# Patient Record
Sex: Female | Born: 1965 | Race: Black or African American | Hispanic: No | Marital: Single | Smoking: Never smoker
Health system: Southern US, Community
[De-identification: ages and names within clinical notes are randomized; demographics above are authoritative.]

## PROBLEM LIST (undated history)

## (undated) DIAGNOSIS — Z91018 Allergy to other foods: Secondary | ICD-10-CM

## (undated) DIAGNOSIS — M069 Rheumatoid arthritis, unspecified: Secondary | ICD-10-CM

## (undated) DIAGNOSIS — J309 Allergic rhinitis, unspecified: Secondary | ICD-10-CM

## (undated) DIAGNOSIS — J45909 Unspecified asthma, uncomplicated: Secondary | ICD-10-CM

## (undated) DIAGNOSIS — L563 Solar urticaria: Secondary | ICD-10-CM

## (undated) DIAGNOSIS — Z930 Tracheostomy status: Secondary | ICD-10-CM

## (undated) HISTORY — DX: Solar urticaria: L56.3

## (undated) HISTORY — PX: OTHER SURGICAL HISTORY: SHX169

## (undated) HISTORY — PX: KNEE SURGERY: SHX244

## (undated) HISTORY — DX: Unspecified asthma, uncomplicated: J45.909

## (undated) HISTORY — PX: HERNIA REPAIR: SHX51

## (undated) HISTORY — DX: Rheumatoid arthritis, unspecified: M06.9

## (undated) HISTORY — PX: VESICOVAGINAL FISTULA CLOSURE W/ TAH: SUR271

## (undated) HISTORY — DX: Allergy to other foods: Z91.018

## (undated) HISTORY — DX: Allergic rhinitis, unspecified: J30.9

## (undated) HISTORY — DX: Tracheostomy status: Z93.0

---

## 2014-01-22 DIAGNOSIS — M1612 Unilateral primary osteoarthritis, left hip: Secondary | ICD-10-CM | POA: Insufficient documentation

## 2014-05-05 DIAGNOSIS — M76899 Other specified enthesopathies of unspecified lower limb, excluding foot: Secondary | ICD-10-CM | POA: Insufficient documentation

## 2014-05-05 DIAGNOSIS — M24573 Contracture, unspecified ankle: Secondary | ICD-10-CM | POA: Insufficient documentation

## 2014-05-05 DIAGNOSIS — M7502 Adhesive capsulitis of left shoulder: Secondary | ICD-10-CM | POA: Insufficient documentation

## 2015-08-04 ENCOUNTER — Other Ambulatory Visit: Payer: Self-pay | Admitting: Allergy

## 2015-08-04 MED ORDER — MONTELUKAST SODIUM 10 MG PO TABS: 10.0000 mg | ORAL_TABLET | Freq: Every day | ORAL | Status: DC

## 2015-09-15 ENCOUNTER — Other Ambulatory Visit: Payer: Self-pay | Admitting: Pediatrics

## 2015-11-03 ENCOUNTER — Encounter: Payer: Self-pay | Admitting: Pediatrics

## 2015-11-03 ENCOUNTER — Ambulatory Visit (INDEPENDENT_AMBULATORY_CARE_PROVIDER_SITE_OTHER): Payer: Medicare Other | Admitting: Pediatrics

## 2015-11-03 VITALS — BP 100/68 | HR 86 | Temp 97.9°F | Resp 16 | Ht 67.72 in | Wt 184.6 lb

## 2015-11-03 DIAGNOSIS — M069 Rheumatoid arthritis, unspecified: Secondary | ICD-10-CM

## 2015-11-03 DIAGNOSIS — J301 Allergic rhinitis due to pollen: Secondary | ICD-10-CM | POA: Diagnosis not present

## 2015-11-03 DIAGNOSIS — T7800XA Anaphylactic reaction due to unspecified food, initial encounter: Secondary | ICD-10-CM | POA: Insufficient documentation

## 2015-11-03 DIAGNOSIS — Z430 Encounter for attention to tracheostomy: Secondary | ICD-10-CM

## 2015-11-03 DIAGNOSIS — T7800XD Anaphylactic reaction due to unspecified food, subsequent encounter: Secondary | ICD-10-CM | POA: Diagnosis not present

## 2015-11-03 DIAGNOSIS — J4540 Moderate persistent asthma, uncomplicated: Secondary | ICD-10-CM | POA: Diagnosis not present

## 2015-11-03 MED ORDER — MONTELUKAST SODIUM 10 MG PO TABS: 10.0000 mg | ORAL_TABLET | Freq: Every day | ORAL | Status: DC

## 2015-11-03 MED ORDER — HYDROXYZINE HCL 25 MG PO TABS: 25.0000 mg | ORAL_TABLET | Freq: Three times a day (TID) | ORAL | Status: DC | PRN

## 2015-11-03 MED ORDER — MOMETASONE FUROATE 50 MCG/ACT NA SUSP: NASAL | Status: DC

## 2015-11-03 MED ORDER — PROAIR HFA 108 (90 BASE) MCG/ACT IN AERS: INHALATION_SPRAY | RESPIRATORY_TRACT | Status: DC

## 2015-11-03 MED ORDER — LEVOCETIRIZINE DIHYDROCHLORIDE 5 MG PO TABS: ORAL_TABLET | ORAL | Status: DC

## 2015-11-03 MED ORDER — ALBUTEROL SULFATE (2.5 MG/3ML) 0.083% IN NEBU: 2.5000 mg | INHALATION_SOLUTION | RESPIRATORY_TRACT | Status: DC | PRN

## 2015-11-03 NOTE — Progress Notes (Signed)
[address] [place] [address] Dept: [phone]  FOLLOW UP NOTE  Patient ID: Katie Gates, female    DOB: 1966-01-01  Age: 50 y.o. MRN: [id number] Date of Office Visit: 11/03/2015  Assessment Chief Complaint: Urticaria and Nasal Congestion  HPI Katie Gates presents for evaluation of significant allergic symptoms in the past month. She has noticed some itchy skin also. Her asthma has been well controlled. She has been having nasal congestion.. 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 She avoids strawberries. She also avoids tree nuts, red dyes and onion  Current medications Symbicort 160/4.52 puffs every 12 hours, albuterol 0.083% one unit dose every 4 hours if needed, levocetirizine 5 mg in the morning, Singulair 10 mg at night, pro-air 2 puffs every 4 hours if needed, Spiriva Respimat 2.5 MCG's 1 puff every 24 hours. Her other medications are outlined in the chart.   Drug Allergies:  Allergies  Allergen Reactions  . Fluticasone Other (See Comments)    NOSE BLEEDS  . Keflex [Cephalexin]   . Robitussin (Alcohol Free) [Guaifenesin]     Physical Exam: BP 100/68 mmHg  Pulse 86  Temp(Src) 97.9 F (36.6 C) (Oral)  Resp 16  Ht 5' 7.72" (1.72 m)  Wt 184 lb 9.6 oz (83.734 kg)  BMI 28.30 kg/m2   Physical Exam  Constitutional: She is oriented to person, place, and time. She appears well-developed and well-nourished.  HENT:  Eyes normal. Ears normal. Nose moderate swelling of nasal turbinates. Pharynx normal.  Neck: Neck supple.  She has a tracheostomy  Cardiovascular:  S1 and S2 normal no murmurs  Pulmonary/Chest:  Clear to percussion and auscultation  Lymphadenopathy:    She has no cervical adenopathy.  Neurological: She is alert and oriented to person, place, and time.  Skin:  Clear  Psychiatric: She has a normal mood and affect. Her behavior is normal. Judgment and thought content normal.  Vitals reviewed.   Diagnostics:  FVC  3.53 L FEV1 2.74 L. Predicted FVC 3.19 L predicted FEV1 2.55 L-the spirometry is in the normal range  Assessment and Plan: 1. Moderate persistent asthma, uncomplicated   2. Allergic rhinitis due to pollen   3. Rheumatoid arthritis, involving unspecified site, unspecified rheumatoid factor presence (HCC)   4. Tracheostomy care (HCC)   5. Allergy with anaphylaxis due to food, subsequent encounter     Meds ordered this encounter  Medications  . PROAIR HFA 108 (90 Base) MCG/ACT inhaler    Sig: TAKE TWO PUFFS EVERY 4-6 HOURS AS NEEDED FOR COUGH OR WHEEZE.    Dispense:  8 g    Refill:  1  . montelukast (SINGULAIR) 10 MG tablet    Sig: Take 1 tablet (10 mg total) by mouth at bedtime.    Dispense:  90 tablet    Refill:  1  . levocetirizine (XYZAL) 5 MG tablet    Sig: TAKE ONE TABLET BY MOUTH ONCE A DAY FOR RUNNY NOSE OR ITCHING    Dispense:  30 tablet    Refill:  5  . albuterol (PROVENTIL) (2.5 MG/3ML) 0.083% nebulizer solution    Sig: Take 3 mLs (2.5 mg total) by nebulization every 4 (four) hours as needed for wheezing or shortness of breath.    Dispense:  75 mL    Refill:  1  . hydrOXYzine (ATARAX/VISTARIL) 25 MG tablet    Sig: Take 1 tablet (25 mg total) by mouth 3 (three) times daily as needed.    Dispense:  30  tablet    Refill:  1    Patient Instructions  Add hydroxyzine 25 mg-one tablet at night for itching Nasonex 1 spray per nostril twice a day for stuffy nose If your allergic symptoms get worse add prednisone 10 mg twice a day for 4 days, 10 mg on the fifth day Continue on your other medications but stop ipratropium nose spray Call me if you are not doing well on this treatment plan     Return in about 4 months (around 03/04/2016).    Thank you for the opportunity to care for this patient.  Please do not hesitate to contact me with questions.  Tonette Bihari, M.D.  [facility] [address] [place], [place] [address] [phone]

## 2015-11-03 NOTE — Patient Instructions (Signed)
Add hydroxyzine 25 mg-one tablet at night for itching Nasonex 1 spray per nostril twice a day for stuffy nose If your allergic symptoms get worse add prednisone 10 mg twice a day for 4 days, 10 mg on the fifth day Continue on your other medications but stop ipratropium nose spray Call me if you are not doing well on this treatment plan

## 2016-03-06 ENCOUNTER — Ambulatory Visit: Payer: Self-pay | Admitting: Pediatrics

## 2016-03-16 ENCOUNTER — Encounter: Payer: Self-pay | Admitting: Allergy & Immunology

## 2016-03-16 ENCOUNTER — Ambulatory Visit (INDEPENDENT_AMBULATORY_CARE_PROVIDER_SITE_OTHER): Payer: Medicare Other | Admitting: Allergy & Immunology

## 2016-03-16 VITALS — BP 110/70 | HR 88 | Temp 98.2°F | Resp 18

## 2016-03-16 DIAGNOSIS — J310 Chronic rhinitis: Secondary | ICD-10-CM | POA: Diagnosis not present

## 2016-03-16 DIAGNOSIS — T781XXD Other adverse food reactions, not elsewhere classified, subsequent encounter: Secondary | ICD-10-CM

## 2016-03-16 DIAGNOSIS — J4550 Severe persistent asthma, uncomplicated: Secondary | ICD-10-CM | POA: Diagnosis not present

## 2016-03-16 DIAGNOSIS — L508 Other urticaria: Secondary | ICD-10-CM

## 2016-03-16 MED ORDER — EPINEPHRINE 0.3 MG/0.3ML IJ SOAJ: 0.3000 mg | Freq: Once | INTRAMUSCULAR | 1 refills | Status: AC

## 2016-03-16 MED ORDER — HYDROCORTISONE 2.5 % EX CREA: TOPICAL_CREAM | Freq: Two times a day (BID) | CUTANEOUS | 0 refills | Status: DC

## 2016-03-16 NOTE — Progress Notes (Signed)
FOLLOW UP  Date of Service/Encounter:  03/16/16   Assessment:   Severe persistent asthma, uncomplicated  Chronic urticaria  Adverse food reaction, subsequent encounter  Chronic rhinitis   Asthma Reportables:  Severity: : severe persistent  Risk: high due to multiple comorbid conditions Control: well controlled  Seasonal Influenza Vaccine: no but encouraged    Plan/Recommendations:    1. Severe persistent asthma, uncomplicated - Continue Symbicort, Spiriva, Singulair - Continue albuterol as needed and regularly through the day. - Spirometry looks very good today.  2. Chronic urticaria - Increase cetirizine 10mg  twice daily.  - Continue hydroxyzine nightly.  - Could consider the addition of Xolair in the future, however unsure if this can be given in conjunction with her rheumatoid arthritis biologics.  3. Food allergies (beans, peanuts, onions, red dye) - EpiPen refilled today. - Continue to avoid all of your food allergy triggers. - Patient not interested in retesting and introduction at this time.   4. Chronic rhinitis - Continue Atrovent as needed. - Continue Nasonex, but increase to two sprays per nostril once daily. - We can consider another nasal spray if this is not working. - I also discussed allergen immunotherapy with patient, which could control her rhinitis as well as asthma. However, the last testing was positive only to grasses, therefore it would be debatable how helpful it would be. - Given that she is endorsing symptoms around so many different environments, repeat skin testing could be justified. - If she has more allergens identified via skin testing, we could consider allergen immunotherapy in the future.  5. Return to clinic in three months. We can do repeat skin testing at that time.    Subjective:   Katie Gates is a 50 y.o. female presenting today for follow up of  Chief Complaint  Patient presents with  . Follow-up    pt is  doing alot of itching when she goes outside in her eyes and on her skin Her eyes will get puffy  .  Katie Gates has a history of the following: Patient Active Problem List   Diagnosis Date Noted  . Moderate persistent asthma 11/03/2015  . Allergic rhinitis due to pollen 11/03/2015  . Rheumatoid arthritis (HCC) 11/03/2015  . Tracheostomy care (HCC) 11/03/2015  . Allergy with anaphylaxis due to food 11/03/2015    History obtained from: chart review and patient.  Katie Gates was referred by Dondra Prader., MD.     Yuval is a 50 y.o. female presenting for a follow up visit for severe persistent asthma, allergic rhinitis, urticaria. Maycel has a very compensated past medical history including rheumatoid arthritis and several complications including lung disease, calcified vocal cords, and esophageal thickening. She is followed closely by rheumatologist, pulmonologist, and gastroenterologist. She was last seen in March 2017 with urticaria and nasal congestion. At that time, she was continued on all of her asthma medications. Nasonex was added for her stuffy nose, and hydroxyzine 25 mg was added at night for itching. She was also given a prednisone burst to help with her urticaria.  Katie Gates reports today that she is doing well. Her hives and not been an issue since last visit, she received a cortisone shot. She continues to have problems with itching especially when she goes outside of the home. She breaks out when she opens the car door with the pollen. Everything "goes to hell" when she goes outside. Cleaning products and other environmental triggers make things worse as well. Therefore she is compensated by spending  most of her time indoors.  Asthma/Respiratory Symptom History: Rescue medication: albuterol nebulizer regularly throughout the day Controller(s): Symbicort 160/4.5 two puffs BID with a spacer, Singulair, Spiriva Respimat 2 puffs daily, (sleeps with O2 at  night) Triggers:  dust, fumes, pollens, smoke and strong odors  Average frequency of daytime symptoms: frequent Average frequency of nocturnal symptoms: frequent  Average frequency of exercise symptoms: absent Hospitalizations for respiratory symptoms since last visit (self report): 0 ED or Urgent Care visits for respiratory symptoms since last visit (self report): 1 (Jan or Feb) Oral/systemic corticosteroids for respiratory symptoms since last visit: varies  Allergic Rhinitis Symptom History:  Symptoms: rhinorrhea Times of year: Throughout the year Exacerbating environments: dust, fumes, pollens, smoke and strong odors  Treatments tried: Atrovent PRN works well but causes headaches, Nasonex one spray once daily, (has trialed fluticasone).  Allergy tested in the past: Yes (tested in April 2013 showed sensitization to 2 types of grasses only) On IT in the past: No    Food Allergies: Avoid pinto beans (oral itching), red dye (migraine), onions (migraine), peanuts (hives, breathing difficulties)  She does have an EpiPen on all times. She is not interested in trying these foods and is fine with continued avoidance. She does have lactose intolerance as well.   Otherwise, there have been no changes to the past medical history, surgical history, family history, or social history. She is currently disabled and spends much of her time in her home since she is able to limit her exposures.   Review of Systems: a 14-point review of systems is pertinent for what is mentioned in HPI.  Otherwise, all other systems were negative. Constitutional: negative other than that listed in the HPI Eyes: negative other than that listed in the HPI Ears, nose, mouth, throat, and face: negative other than that listed in the HPI Respiratory: negative other than that listed in the HPI Cardiovascular: negative other than that listed in the HPI Gastrointestinal: negative other than that listed in the HPI Genitourinary:  negative other than that listed in the HPI Integument: negative other than that listed in the HPI Hematologic: negative other than that listed in the HPI Musculoskeletal: negative other than that listed in the HPI Neurological: negative other than that listed in the HPI Allergy/Immunologic: negative other than that listed in the HPI    Objective:   Blood pressure 110/70, pulse 88, temperature 98.2 F (36.8 C), temperature source Oral, resp. rate 18, SpO2 96 %. There is no height or weight on file to calculate BMI.  Physical Exam:  General: Alert, interactive, in no acute distress. HEENT: TMs pearly gray, turbinates edematous and pale without discharge, post-pharynx erythematous. Tracheostomy in place without discharge.  Neck: Supple without lymphadenopathy. Lungs: Clear to auscultation without wheezing, rhonchi or rales. Moving air well in all lung fields.  CV: Normal S1, S2 without murmurs. Abdomen: Nondistended, nontender. Skin:Warm and dry, without lesions or rashes. Extremities: No clubbing, cyanosis or edema. Neuro: Grossly intact. No focal deficits noted   Diagnostic studies:  Spirometry: results normal (FEV1: 2.06/89%, FVC: 2.80/92%, FEV1/FVC: 73%).    Spirometry consistent with normal pattern      Malachi Bonds, MD [facility] [place]

## 2016-03-16 NOTE — Patient Instructions (Addendum)
1. Severe persistent asthma, uncomplicated - Continue Symbicort, Spiriva, Singulair - Continue albuterol as needed and regularly through the day.  2. Chronic urticaria - Increase cetirizine 10mg  twice daily.  - Continue hydroxyzine nightly.   3. Food allergies (beans, peanuts, onions, red dye) - EpiPen refilled today. - Continue to avoid all of your food allergy triggers.  4. Chronic rhinitis - Continue Atrovent as needed. - Continue Nasonex, but increase to two sprays per nostril once daily. - We can consider another nasal spray if this is not working.  5. Return to clinic in three months.   It was a pleasure to meet you today!

## 2016-05-23 ENCOUNTER — Other Ambulatory Visit: Payer: Self-pay | Admitting: Pediatrics

## 2016-06-21 DIAGNOSIS — R948 Abnormal results of function studies of other organs and systems: Secondary | ICD-10-CM | POA: Insufficient documentation

## 2016-06-22 ENCOUNTER — Encounter: Payer: Self-pay | Admitting: Allergy & Immunology

## 2016-06-22 ENCOUNTER — Ambulatory Visit (INDEPENDENT_AMBULATORY_CARE_PROVIDER_SITE_OTHER): Payer: Medicare Other | Admitting: Allergy & Immunology

## 2016-06-22 VITALS — BP 130/88 | HR 85 | Temp 98.1°F | Resp 12

## 2016-06-22 DIAGNOSIS — J4550 Severe persistent asthma, uncomplicated: Secondary | ICD-10-CM

## 2016-06-22 DIAGNOSIS — J3089 Other allergic rhinitis: Secondary | ICD-10-CM

## 2016-06-22 DIAGNOSIS — T781XXD Other adverse food reactions, not elsewhere classified, subsequent encounter: Secondary | ICD-10-CM | POA: Diagnosis not present

## 2016-06-22 DIAGNOSIS — B370 Candidal stomatitis: Secondary | ICD-10-CM | POA: Diagnosis not present

## 2016-06-22 DIAGNOSIS — L508 Other urticaria: Secondary | ICD-10-CM | POA: Diagnosis not present

## 2016-06-22 DIAGNOSIS — J4551 Severe persistent asthma with (acute) exacerbation: Secondary | ICD-10-CM | POA: Diagnosis not present

## 2016-06-22 DIAGNOSIS — J683 Other acute and subacute respiratory conditions due to chemicals, gases, fumes and vapors: Secondary | ICD-10-CM

## 2016-06-22 LAB — PULMONARY FUNCTION TEST

## 2016-06-22 MED ORDER — PROAIR HFA 108 (90 BASE) MCG/ACT IN AERS: INHALATION_SPRAY | RESPIRATORY_TRACT | 1 refills | Status: DC

## 2016-06-22 MED ORDER — ALBUTEROL SULFATE (2.5 MG/3ML) 0.083% IN NEBU: 2.5000 mg | INHALATION_SOLUTION | RESPIRATORY_TRACT | 1 refills | Status: DC | PRN

## 2016-06-22 MED ORDER — FLUCONAZOLE 150 MG PO TABS: 150.0000 mg | ORAL_TABLET | Freq: Every day | ORAL | 1 refills | Status: DC

## 2016-06-22 MED ORDER — LEVOCETIRIZINE DIHYDROCHLORIDE 5 MG PO TABS: 5.0000 mg | ORAL_TABLET | Freq: Every day | ORAL | 5 refills | Status: DC

## 2016-06-22 MED ORDER — MONTELUKAST SODIUM 10 MG PO TABS: 10.0000 mg | ORAL_TABLET | Freq: Every day | ORAL | 3 refills | Status: DC

## 2016-06-22 MED ORDER — LEVOFLOXACIN 500 MG PO TABS: 500.0000 mg | ORAL_TABLET | Freq: Every day | ORAL | 0 refills | Status: DC

## 2016-06-22 MED ORDER — HYDROCORTISONE 2.5 % EX CREA: TOPICAL_CREAM | Freq: Two times a day (BID) | CUTANEOUS | 5 refills | Status: DC

## 2016-06-22 MED ORDER — EPINEPHRINE 0.3 MG/0.3ML IJ SOAJ: INTRAMUSCULAR | 3 refills | Status: DC

## 2016-06-22 MED ORDER — SPIRIVA RESPIMAT 2.5 MCG/ACT IN AERS: INHALATION_SPRAY | RESPIRATORY_TRACT | 5 refills | Status: DC

## 2016-06-22 NOTE — Patient Instructions (Addendum)
1. Severe persistent asthma with acute exacerbation - Continue Symbicort, Spiriva, Singulair - Continue albuterol as needed and regularly through the day. - Start the prednisone pack provided in clinic (10mg  BID x 5 days) - We will get a serum IgE in case we decide to start Xolair.  - I will talk to the scientific team for Xolair to see if we can use it with Humira.   2. Chronic urticaria - Increase cetirizine 10mg  twice daily.   - Continue hydroxyzine nightly.   3. Food allergies (beans, peanuts, onions, red dye) - EpiPen refilled today. - Continue to avoid all of your food allergy triggers.  4. Chronic rhinitis - with acute sinusitis - Start Levaquin 500mg  daily for 14 days.  - We will send in a prescription for fluconazole to treat your oral thrush.  - I feel that there is something that we are missing in your allergy picture, therefore we will get an environmental allergy panel to see if there are any allergens we are missing.  - You can get the lab work done with your next regular blood draw.  - Continue Atrovent as needed. - Continue Nasonex, but increase to two sprays per nostril once daily. - We can consider another nasal spray if this is not working.  5. Return in about 3 months (around 09/22/2016).  Please inform of any Emergency Department visits, hospitalizations, or changes in symptoms. Call before going to the ED for breathing or allergy symptoms since we might be able to fit you in for a sick visit. Feel free to contact 09/24/2016 anytime with any questions, problems, or concerns.  It was a pleasure to see you again today!   Websites that have reliable patient information: 1. American Academy of Asthma, Allergy, and Immunology: www.aaaai.org 2. Food Allergy Research and Education (FARE): foodallergy.org 3. Mothers of Asthmatics: http://www.asthmacommunitynetwork.org 4. American College of Allergy, Asthma, and Immunology: www.acaai.org

## 2016-06-22 NOTE — Progress Notes (Addendum)
FOLLOW UP  Date of Service/Encounter:  06/22/16   Assessment:   Severe persistent asthma with acute exacerbation  Chronic urticaria  Adverse food reaction, subsequent encounter  Chronic nonseasonal allergic rhinitis due to pollen  Oral thrush   Asthma Reportables:  Severity: severe persistent  Risk: high due to multiple comorbitidies Control: not well controlled  Seasonal Influenza Vaccine: yes    Plan/Recommendations:   1. Severe persistent asthma with acute exacerbation - with a likely component of reactive airway dysfunction syndrome (sensitivities to scents, perfumes, etc) - Katie Gates does have moderate restriction on spirometry today (normal last time) without reversibility.  - Moderate restriction might be due to her concurrent sinus infection, therefore if present at the next appointment we will consider full pulmonary function testing. - Continue Symbicort, Spiriva, Singulair - Continue albuterol as needed and regularly through the day. - Start the prednisone pack provided in clinic (10mg  BID x 5 days) - We will get a serum IgE in case we decide to start Xolair.  - I did find a AAAAI web site Ask the Allergist article which recommended that multiple monoclonals can be used in the same patient. - The article pointed out that monoclonals are so specific that there are side effects or toxicities noted.   2. Chronic urticaria - Increase cetirizine 10mg  twice daily.   - Continue hydroxyzine nightly.  - I would prefer the addition of the Xolair, which would treat urticaria as well as the asthma.  - We also have the most experience with Xolair in the clinical setting.  3. Food allergies (beans, peanuts, onions, red dye) - EpiPen refilled today. - Continue to avoid all of your food allergy triggers.  4. Chronic rhinitis - with acute sinusitis - Start Levaquin 500mg  daily for 14 days.  - We will send in a prescription for fluconazole to treat your oral thrush.    - I feel that there is something that we are missing in your allergy picture, therefore we will get an environmental allergy panel to see if there are any allergens we are missing.  - You can get the lab work done with your next regular blood draw.  - Continue Atrovent as needed. - Continue Nasonex, but increase to two sprays per nostril once daily. - We can consider another nasal spray if this is not working.  5. Rheumatoid arthritis - stable on Humira with methotrexate - Continue with current treatment plan. - She does receive regular visits with Dr.  6. Return in about 3 months (around 09/22/2016).    Subjective:   Katie Gates is a 50 y.o. female presenting today for follow up of  Chief Complaint  Patient presents with  . Asthma    cough and drainage, yellow mucus  . Headache    sinus headache  .  Katie Gates has a history of the following: Patient Active Problem List   Diagnosis Date Noted  . Moderate persistent asthma 11/03/2015  . Allergic rhinitis due to pollen 11/03/2015  . Rheumatoid arthritis (HCC) 11/03/2015  . Tracheostomy care (HCC) 11/03/2015  . Allergy with anaphylaxis due to food 11/03/2015    History obtained from: chart review and patient.  Katie Gates was referred by 11/05/2015., MD.     Katie Gates is a 50 y.o. female presenting for a follow up visit. She was last seen in August 2017. At that time, I continued her on Symbicort, Spiriva, and Singulair. Her spirometry looked great. For her chronic urticaria, we  continued her on cetirizine 10 mg twice daily as well as hydroxyzine nightly. We refilled her EpiPen for her food allergies. She was not interested in retesting at that time. Her chronic rhinitis was stable with Atrovent as needed as well as Nasonex. We did increase her Nasonex to 2 sprays per nostril daily. We discussed performing skin testing at the next visit in hopes of finding more allergies for future  immunotherapy.  Since last visit, she has done well. She has had a sinus headache that has been going on for two weeks. She is using nasal saline rinses and Flonase with minimal relief. She estimates that she needs antibiotics around once per year, typically in the fall season. She does have an allergy to Keflex, which causes a head-to-toe rash. She has had more coughing and shortness of breath with the increased nasal drainage. She continues to have urticaria breakouts, even on the cetirizine and the hydroxyzine. The lesions do resolve and leave normal skin. There are no concurrent fevers but she does have baseline joint pain from her rheumatoid arthritis.   Katie Gates breathing has been less than ideal. She reports worsening respiratory problems with various triggers including perfumes and scents. She does have sensitivity to wool and has problems when she goes outside. She shares that she needs to cover up and sometimes wear a mask. We had discussed starting a biologic at the last visit to help with her breathing. She did have mild eosinophilia in the past (max of 100 around one year ago). I cannot find an IgE in the system at all.   Otherwise, there have been no changes to her past medical history, surgical history, family history, or social history.    Review of Systems: a 14-point review of systems is pertinent for what is mentioned in HPI.  Otherwise, all other systems were negative. Constitutional: negative other than that listed in the HPI Eyes: negative other than that listed in the HPI Ears, nose, mouth, throat, and face: negative other than that listed in the HPI Respiratory: negative other than that listed in the HPI Cardiovascular: negative other than that listed in the HPI Gastrointestinal: negative other than that listed in the HPI Genitourinary: negative other than that listed in the HPI Integument: negative other than that listed in the HPI Hematologic: negative other than that  listed in the HPI Musculoskeletal: negative other than that listed in the HPI Neurological: negative other than that listed in the HPI Allergy/Immunologic: negative other than that listed in the HPI    Objective:   Blood pressure 130/88, pulse 85, temperature 98.1 F (36.7 C), temperature source Oral, resp. rate 12, SpO2 97 %. There is no height or weight on file to calculate BMI.   Physical Exam:  General: Alert, interactive, in no acute distress. Very friendly and gracious. HEENT: TMs pearly gray, turbinates edematous without discharge, post-pharynx erythematous. Neck: Supple without thyromegaly. Lungs: Clear to auscultation without wheezing, rhonchi or rales. No increased work of breathing. Tracheostomy in place.  CV: Normal S1/S2, no murmurs. Capillary refill <2 seconds.  Abdomen: Nondistended, nontender. No guarding or rebound tenderness. Bowel sounds faint and present in all fields   Skin: Warm and dry, without lesions or rashes. Extremities:  No clubbing, cyanosis or edema. Neuro:   Grossly intact. No deficits noted. She is moving rather slowly today secondary to a recent fall.   Diagnostic studies:  Spirometry: results abnormal (FEV1: 1.61/63%, FVC: 1.90/60%, FEV1/FVC: 85%).    Spirometry consistent with normal pattern.  DuoNeb nebulizer treatment given in clinic with no improvement. In fact, while the FEV1 and FVC both remained stable, the FEF25-75% actually decreased following the treatment.   Allergy Studies: None    Malachi Bonds, MD [facility] [place]

## 2016-06-22 NOTE — Progress Notes (Signed)
I will contact patient next week and discuss Xolair option and get her part D card info

## 2016-06-26 ENCOUNTER — Other Ambulatory Visit: Payer: Self-pay

## 2016-06-26 MED ORDER — ALBUTEROL SULFATE (2.5 MG/3ML) 0.083% IN NEBU: 2.5000 mg | INHALATION_SOLUTION | RESPIRATORY_TRACT | 1 refills | Status: DC | PRN

## 2016-07-03 ENCOUNTER — Other Ambulatory Visit: Payer: Self-pay | Admitting: *Deleted

## 2016-07-03 MED ORDER — ALBUTEROL SULFATE (2.5 MG/3ML) 0.083% IN NEBU: 2.5000 mg | INHALATION_SOLUTION | RESPIRATORY_TRACT | 1 refills | Status: DC | PRN

## 2016-09-11 ENCOUNTER — Telehealth: Payer: Self-pay | Admitting: Allergy

## 2016-09-11 NOTE — Telephone Encounter (Signed)
Per patients insurance Spiriva not covered. Incruse Ellipta is covered under tier 3.Please advise.

## 2016-09-11 NOTE — Telephone Encounter (Signed)
We can substitute the Incruse Ellipta. I do not have any experience with it, but I ancipitate it should work just as well.  Thanks, Malachi Bonds, MD [facility] [place]

## 2016-09-12 ENCOUNTER — Other Ambulatory Visit: Payer: Self-pay | Admitting: Allergy

## 2016-09-12 ENCOUNTER — Telehealth: Payer: Self-pay | Admitting: Allergy

## 2016-09-12 MED ORDER — UMECLIDINIUM BROMIDE 62.5 MCG/INH IN AEPB: 1.0000 | INHALATION_SPRAY | Freq: Every day | RESPIRATORY_TRACT | 3 refills | Status: DC

## 2016-09-12 NOTE — Telephone Encounter (Addendum)
Informed patient insurance would not pay for Spiriva.Per Dr Dellis Anes called in [facility].

## 2016-09-12 NOTE — Telephone Encounter (Signed)
Dr. Dellis Anes what is the directions for the Incruse.

## 2016-09-12 NOTE — Telephone Encounter (Signed)
One inhalation once daily.   Thanks, Malachi Bonds, MD [facility] [place]

## 2016-09-13 NOTE — Telephone Encounter (Signed)
Called in rx and let pt. Know.

## 2016-09-21 ENCOUNTER — Ambulatory Visit (INDEPENDENT_AMBULATORY_CARE_PROVIDER_SITE_OTHER): Payer: Medicare Other | Admitting: Allergy & Immunology

## 2016-09-21 ENCOUNTER — Encounter: Payer: Self-pay | Admitting: Allergy & Immunology

## 2016-09-21 VITALS — BP 104/70 | HR 88 | Temp 98.5°F | Resp 24

## 2016-09-21 DIAGNOSIS — L508 Other urticaria: Secondary | ICD-10-CM

## 2016-09-21 DIAGNOSIS — T781XXD Other adverse food reactions, not elsewhere classified, subsequent encounter: Secondary | ICD-10-CM

## 2016-09-21 DIAGNOSIS — J3089 Other allergic rhinitis: Secondary | ICD-10-CM

## 2016-09-21 DIAGNOSIS — R942 Abnormal results of pulmonary function studies: Secondary | ICD-10-CM

## 2016-09-21 DIAGNOSIS — J4550 Severe persistent asthma, uncomplicated: Secondary | ICD-10-CM | POA: Diagnosis not present

## 2016-09-21 NOTE — Patient Instructions (Addendum)
1. Severe persistent asthma - with stable spirometry from last time - Continue Symbicort, Spiriva, Singulair - Continue albuterol as needed and regularly through the day. - We will get a serum IgE in case we decide to start Xolair ([facility] lost the last one). - It is safe to use two monoclonal antibodies together (i.e. Xolair with Humira).   2. Chronic urticaria - Continue with cetirizine 10mg  twice daily.   - Continue hydroxyzine nightly.   3. Food allergies (beans, peanuts, onions, red dye) - EpiPen refilled today. - Continue to avoid all of your food allergy triggers.  4. Chronic rhinitis - I feel that there is something that we are missing in your allergy picture, therefore we will get an environmental allergy panel to see if there are any allergens we are missing.  - We will get that lab work today ([facility] lost the last one). - Continue Atrovent as needed. - Continue Nasonex two sprays per nostril once daily. - We can consider another nasal spray if this is not working.  5. Return in about 3 months (around 12/19/2016).  Please inform 12/21/2016 of any Emergency Department visits, hospitalizations, or changes in symptoms. Call us before going to the ED for breathing or allergy symptoms since we might be able to fit you in for a sick visit. Feel free to contact us anytime with any questions, problems, or concerns.  It was a pleasure to see you again today!   Websites that have reliable patient information: 1. American Academy of Asthma, Allergy, and Immunology: www.aaaai.org 2. Food Allergy Research and Education (FARE): foodallergy.org 3. Mothers of Asthmatics: http://www.asthmacommunitynetwork.org 4. American College of Allergy, Asthma, and Immunology: www.acaai.org

## 2016-09-21 NOTE — Progress Notes (Signed)
FOLLOW UP  Date of Service/Encounter:  09/21/16   Assessment:   Severe persistent asthma, uncomplicated  Adverse food reaction (beans, peanuts, onions, red dye)  Chronic urticaria  Chronic allergic rhinitis    Asthma Reportables:  Severity: severe persistent  Risk: high Control: not well controlled  Seasonal Influenza Vaccine: yes    Plan/Recommendations:   1. Severe persistent asthma with acute exacerbation - with a likely component of reactive airway dysfunction syndrome (sensitivities to scents, perfumes, etc) - Katie Gates does have moderate restriction on spirometry today (was normal in August 2017).  - Could consider full pulmonary function testing in the future.  - Continue with Symbicort two puffs twice daily, Incruse (replaced Spiriva due to insurance coverage), and Singulair daily.  - Continue albuterol as needed and regularly through the day. - We will get a serum IgE in case we decide to start Xolair ([facility] lost the last one). - It is safe to use two monoclonal antibodies together (i.e. Xolair with Humira) as they are so specific that there are few side effects or toxicities noted when used together.   2. Chronic urticaria - Continue with cetirizine 64m twice daily.   - Continue hydroxyzine nightly.  - I would prefer the addition of the Xolair, which would treat urticaria as well as the asthma.  - We also have the most experience with Xolair in the clinical setting.  3. Food allergies (beans, peanuts, onions, red dye) - EpiPen refilled today. - Continue to avoid all of your food allergy triggers.  4. Chronic rhinitis - I feel that there is something that we are missing in your allergy picture, therefore we will get an environmental allergy panel to see if there are any allergens we are missing.  - We will get that lab work today ([facility] lost the last one). - Continue Atrovent as needed. - Continue Nasonex two sprays per nostril once daily. - We  can consider another nasal spray if this is not working.  5. Rheumatoid arthritis - stable on Humira with methotrexate - Continue with current treatment plan. - She does receive regular visits with Dr. RFlorene Glen 6. Return in about 3 months (around 12/19/2016).   Subjective:   Katie Gates a 51y.o. female presenting today for follow up of  Chief Complaint  Patient presents with  . Cough    VClydene Burackhas a history of the following: Patient Active Problem List   Diagnosis Date Noted  . Abnormal biliary HIDA scan 06/21/2016  . Moderate persistent asthma 11/03/2015  . Allergic rhinitis due to pollen 11/03/2015  . [place] ([place]  . [place] ([place] 11/03/[place]  . 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 Adhesive bursitis of left shoulder 05/05/2014  . 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 Arthritis of left hip 01/22/2014    History obtained from: chart review and patient.  Katie Gates referred by SVerdell Carmine, MD.     Katie Gates a 51y.o. female presenting for a follow up visit. [name] last seen in November 2017. At that time, she was diagnosed with a asthma exacerbation and started on a small dose of prednisone. She did have moderate restriction on her spirometry which I felt was secondary to her sinus infection. It had been normal in August 2017. I continued her on Symbicort, Spiriva, and Singulair as well as albuterol as needed. We did discuss starting Xolair and I ordered  a serum IgE. Her chronic urticaria was under good control with cetirizine. She continue to avoid all of her food triggers for her food allergies. He treated her with 14 days of Levaquin for her acute sinusitis and obtained an environmental allergy panel to see if there are any allergens contributing to her symptoms. She was continued on Atrovent as needed and Nasonex. She had concerns last time about  starting Xolair given the fact that she is on Humira. However, I discussed her situation with some other allergists who felt that two biologics would have minimal side effects since there mechanisms of action are so specifically targeted.  Since last visit, she has mostly done well. However, she continues to cough when she is out in public. As long as she is inside her home, she does very well. When going out in public around various scents and exposures, she does have increased coughing including rhinorrhea as well as itchy watery eyes. She has tried wearing mask in public, but this has not helped. She does report that she got her blood work, but no blood work showing up in her system. We did call [facility] today, who reported that they lost her blood sample. Her Spiriva was not covered by her insurance, and Incruse Ellipta (umeclidinium) was substituted.  As long she is inside her home, her allergic rhinitis is well controlled. She has had no problems with urticaria; she is on cetirizine 10 mg twice daily. She did have one breakout prior to Christmas. She feels that her rheumatoid arthritis is well controlled on the Humira and methotrexate. 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   Otherwise, there have been no changes to her past medical history, surgical history, family history, or social history.    Review of Systems: a 14-point review of systems is pertinent for what is mentioned in HPI.  Otherwise, all other systems were negative. Constitutional: negative other than that listed in the HPI Eyes: negative other than that listed in the HPI Ears, nose, mouth, throat, and face: negative other than that listed in the HPI Respiratory: negative other than that listed in the HPI Cardiovascular: negative other than that listed in the HPI Gastrointestinal: negative other than that listed in the HPI Genitourinary: negative other than that listed in the HPI Integument: negative other than that listed in the  HPI Hematologic: negative other than that listed in the HPI Musculoskeletal: negative other than that listed in the HPI Neurological: negative other than that listed in the HPI Allergy/Immunologic: negative other than that listed in the HPI    Objective:   Blood pressure 104/70, pulse 88, temperature 98.5 F (36.9 C), temperature source Oral, resp. rate (!) 24. There is no height or weight on file to calculate BMI.   Physical Exam:  General: Alert, interactive, in no acute distress. Very friendly and gracious. Smiling.  HEENT: TMs pearly gray, turbinates edematous without discharge, post-pharynx erythematous. Neck:   Supple without thyromegaly. Lungs: Clear to auscultation without wheezing, rhonchi or rales. No increased work of breathing. Some intermittent coughing. Tracheostomy in place.  CV:      Normal S1/S2, no murmurs. Capillary refill <2 seconds.  Skin:    Warm and dry, without lesions or rashes. Extremities:  No clubbing, cyanosis or edema. Neuro:   Grossly intact. No deficits noted.   Diagnostic studies:  Spirometry: results abnormal (FEV1: 1.59/60%, FVC: 1.72/52%, FEV1/FVC: 92%).    Spirometry consistent with possible restrictive disease.   Compared to her values obtained at the last visit,  her FEV1 and FVC are both slightly lower but generally stable. She did have a normal spirometry when I first met her in August 2017.  Allergy Studies: None    Salvatore Marvel, MD [facility]

## 2016-09-23 LAB — CP584 ZONE 3
Allergen, Black Locust, Acacia9: <0.1 kU/L
Allergen, C. Herbarum, M2: <0.1 kU/L
Allergen, Cedar tree, t12: <0.1 kU/L
Allergen, Comm Silver Birch, t9: <0.1 kU/L
Allergen, D pternoyssinus,d7: <0.1 kU/L
Allergen, Mucor Racemosus, M4: <0.1 kU/L
Allergen, Oak,t7: <0.1 kU/L
Allergen, P. notatum, m1: <0.1 kU/L
Bahia Grass: <0.1 kU/L
Cat Dander: <0.1 kU/L
Cockroach: <0.1 kU/L
Common Ragweed: <0.1 kU/L
Dog Dander: <0.1 kU/L
Elm IgE: <0.1 kU/L
Johnson Grass: <0.1 kU/L
Meadow Grass: <0.1 kU/L
Pecan/Hickory Tree IgE: <0.1 kU/L
Plantain: <0.1 kU/L

## 2016-09-23 LAB — IGE: IgE (Immunoglobulin E), Serum: 66 kU/L (ref <115)

## 2016-09-23 NOTE — Addendum Note (Signed)
Addended by: Alfonse Spruce on: 09/23/2016 09:37 PM   Modules accepted: Orders

## 2016-09-25 ENCOUNTER — Telehealth: Payer: Self-pay

## 2016-09-25 NOTE — Telephone Encounter (Signed)
Alfonse Spruce, MD  [facility]        Sorry one more thing - I would really like to get full pulmonary function testing on Katie Gates. She had abnormal spirometry on Friday and her last visit before that. It had been normal in August. I just wanted to make sure we are not sealing with anything more serious than asthma. Thanks!   Malachi Bonds, MD FAAAAI  [facility] with pt and informed her of dr gallaghers stating. She stated she has already scheduled this.

## 2016-09-29 ENCOUNTER — Other Ambulatory Visit: Payer: Self-pay | Admitting: Pediatrics

## 2016-10-02 ENCOUNTER — Ambulatory Visit (HOSPITAL_COMMUNITY)
Admission: RE | Admit: 2016-10-02 | Discharge: 2016-10-02 | Disposition: A | Discharge status: Home / Self Care | Payer: Medicare Other | Source: Ambulatory Visit | Attending: Allergy & Immunology | Admitting: Allergy & Immunology

## 2016-10-02 DIAGNOSIS — J4550 Severe persistent asthma, uncomplicated: Secondary | ICD-10-CM

## 2016-10-02 DIAGNOSIS — R942 Abnormal results of pulmonary function studies: Secondary | ICD-10-CM

## 2016-10-25 ENCOUNTER — Ambulatory Visit (INDEPENDENT_AMBULATORY_CARE_PROVIDER_SITE_OTHER): Payer: Medicare Other | Admitting: *Deleted

## 2016-10-25 DIAGNOSIS — L501 Idiopathic urticaria: Secondary | ICD-10-CM | POA: Diagnosis not present

## 2016-10-25 MED ORDER — OMALIZUMAB 150 MG ~~LOC~~ SOLR
300.0000 mg | SUBCUTANEOUS | Status: DC
  Administered 2016-10-25 – 2016-12-20 (×3): 300 mg via SUBCUTANEOUS

## 2016-11-16 ENCOUNTER — Telehealth: Payer: Self-pay | Admitting: *Deleted

## 2016-11-16 NOTE — Telephone Encounter (Signed)
Pt calling with questions regarding xolair. Informed her that we have delivery date set for 04/17 and she has a xolair appt scheduled with [place] on 04/19 at 10:30.

## 2016-11-22 ENCOUNTER — Ambulatory Visit (INDEPENDENT_AMBULATORY_CARE_PROVIDER_SITE_OTHER): Payer: Medicare Other

## 2016-11-22 DIAGNOSIS — L501 Idiopathic urticaria: Secondary | ICD-10-CM | POA: Diagnosis not present

## 2016-12-20 ENCOUNTER — Ambulatory Visit (INDEPENDENT_AMBULATORY_CARE_PROVIDER_SITE_OTHER): Payer: Medicare Other

## 2016-12-20 DIAGNOSIS — L501 Idiopathic urticaria: Secondary | ICD-10-CM

## 2016-12-21 ENCOUNTER — Ambulatory Visit: Payer: Medicare Other | Admitting: Allergy & Immunology

## 2016-12-21 ENCOUNTER — Ambulatory Visit (INDEPENDENT_AMBULATORY_CARE_PROVIDER_SITE_OTHER): Payer: Medicare Other | Admitting: Allergy & Immunology

## 2016-12-21 ENCOUNTER — Encounter: Payer: Self-pay | Admitting: Allergy & Immunology

## 2016-12-21 VITALS — BP 98/70 | HR 100 | Temp 98.2°F | Resp 18 | Ht 66.5 in | Wt 175.0 lb

## 2016-12-21 DIAGNOSIS — J4550 Severe persistent asthma, uncomplicated: Secondary | ICD-10-CM | POA: Diagnosis not present

## 2016-12-21 DIAGNOSIS — J3089 Other allergic rhinitis: Secondary | ICD-10-CM

## 2016-12-21 DIAGNOSIS — T781XXD Other adverse food reactions, not elsewhere classified, subsequent encounter: Secondary | ICD-10-CM

## 2016-12-21 DIAGNOSIS — D849 Immunodeficiency, unspecified: Secondary | ICD-10-CM

## 2016-12-21 DIAGNOSIS — M069 Rheumatoid arthritis, unspecified: Secondary | ICD-10-CM

## 2016-12-21 DIAGNOSIS — D899 Disorder involving the immune mechanism, unspecified: Secondary | ICD-10-CM

## 2016-12-21 DIAGNOSIS — L501 Idiopathic urticaria: Secondary | ICD-10-CM | POA: Diagnosis not present

## 2016-12-21 DIAGNOSIS — T7819XD Other adverse food reactions, not elsewhere classified, subsequent encounter: Secondary | ICD-10-CM

## 2016-12-21 MED ORDER — MONTELUKAST SODIUM 10 MG PO TABS: 10.0000 mg | ORAL_TABLET | Freq: Every day | ORAL | 3 refills | Status: DC

## 2016-12-21 MED ORDER — METHYLPREDNISOLONE ACETATE 80 MG/ML IJ SUSP
80.0000 mg | Freq: Once | INTRAMUSCULAR | Status: AC
  Administered 2016-12-21: 80 mg via INTRAMUSCULAR

## 2016-12-21 MED ORDER — BUDESONIDE-FORMOTEROL FUMARATE 160-4.5 MCG/ACT IN AERO: 2.0000 | INHALATION_SPRAY | Freq: Two times a day (BID) | RESPIRATORY_TRACT | 5 refills | Status: DC

## 2016-12-21 MED ORDER — IPRATROPIUM-ALBUTEROL 0.5-2.5 (3) MG/3ML IN SOLN: 3.0000 mL | Freq: Four times a day (QID) | RESPIRATORY_TRACT | 1 refills | Status: DC | PRN

## 2016-12-21 MED ORDER — ALBUTEROL SULFATE HFA 108 (90 BASE) MCG/ACT IN AERS: 2.0000 | INHALATION_SPRAY | RESPIRATORY_TRACT | 1 refills | Status: DC | PRN

## 2016-12-21 NOTE — Progress Notes (Signed)
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 Severe persistent asthma with a component of reactive airways dysfunction syndrome - with decreased lung function today following a recent asthma exacerbation - We will work on getting the Spiriva approved instead of the Incruse Ellipta since the Spiriva was working better. - We will work on getting Xolair approved for every two weeks instead of monthly. - We will give a dose of injected steroid to help with improving you lung function and getting you over your current illness. - We will send in a prescription for DuoNeb nebulizer treatments which can be used instead of albuterol to help with opening up your lungs during respiratory flares.  - Daily controller medication(s): Symbicort 160/4.5 two puffs twice daily + Singulair 10mg  once daily + Spiriva two inhalations daily + Xolair monthly - Rescue medications: Proventil 4 puffs every 4-6 hours as needed, albuterol nebulizer one vial puffs every 4-6 hours as needed or DuoNeb nebulizer one vial every 4-6 hours as needed - Asthma control goals:  * Full participation in all desired activities (may need albuterol before activity) * Albuterol use two time or less a week on average (not counting use with activity) * Cough interfering with sleep two time or less a month * Oral steroids no more than once a year * No hospitalizations  2. Chronic urticaria - Continue with cetirizine 10mg  as needed.   - Continue  hydroxyzine nightly.  - Hopefully increasing the Xolair to every two weeks will help control the itching and hives more.   3. Food allergies (beans, peanuts, onions, red dye) - EpiPen is up to date. - Continue to avoid all of your food allergy triggers.  4. Non-allergic rhinitis - with a recent environmental panel that was negative - Continue Atrovent as needed. - Continue Nasonex two sprays per nostril once daily.  5. Return in about 2 months (around 02/20/2017).   Subjective:   Katie Gates is a 51 y.o. female presenting today for follow up of  Chief Complaint  Patient presents with  . Asthma    seen at the ED Saturday for asthma exacerbation. still has ongoing cough. doing somewhat better, but she does still have episodes at night.     Katie Gates has a history of the following: Patient Active Problem List   Diagnosis Date Noted  . Abnormal biliary HIDA scan 06/21/2016  . Moderate persistent asthma 11/03/2015  . Allergic rhinitis due to pollen 11/03/2015  . Rheumatoid arthritis (HCC) 11/03/2015  . Tracheostomy care (HCC) 11/03/2015  . 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 Adhesive bursitis of left shoulder 05/05/2014  . 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 Arthritis of left hip 01/22/2014    History obtained from: chart review and patient.  Katie Gates was referred by 01/24/2014., MD.     Katie Gates is a 51 y.o. female presenting for a follow up visit. 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 She had recently been changed from Spiriva to Incruse due to insurance coverage, but otherwise she remained on Symbicort 160/4.5 two puffs BID as well as Singulair 10mg  daily. We did obtain a  serum IgE, which was elevated enough to qualify her for Xolair, which she started on 10/25/16. She has a history of chronic urticaria which was fairly well controlled with suppressive antihistamine dosing. We felt that there was likely an allergic trigger which we were missing, therefore we sent an environmental sIgE panel which was negative.   Since the last visit, she has not done well. She was in the ED on Thursday for an asthma exacerbation. She was given Decadron injection and albuterol nebulizer treatments. She woke up acutely on Thursday morning one week ago with these symptoms. She had a similar reaction the week before that ED visit. CXR was normal and she was treated with amoxicillin. She is using her albuterol at a lot at home for the last couple of weeks.   After her ER visit, she was asked to follow up with Pulmonology, which she did on May 14th. She was evaluated by May 16 PA, who works with Dr. Helene Kelp. At the time, she had dark secretions and was changed to azithromycin from amoxicillin. Pulmonology sent multiple labs, including a fungal antibody panel, hypersensitivity pneumonitis panel, and a respiratory allergen panel as well as a Quantiferon gold.   Following the visit with Dr. Su Monks, she has done better with regards to her tracheal discharge. It has cleared and lessened significantly. However she continues to cough with deep inspiration. She remains on her albuterol 2-3 times throughout the day. She did receive one dose of Decadron in the ED and was told that she was could go back to the ED for another dose Decadron if she needed it. However, se dopes not want to wait in the ED again, which has kept her from obtaining another steroid injection. She is unsure what triggered her current episode, but her granddaughter was sick with a cold recently.   Katie Gates reports that her Xolair seems to be working well, however it seems to wear off after a couple of weeks. It is controlling her  urticaria well, but after two weeks she starts to itch and her breathing seems to worsen. She has now received two injections of Xolair total.   Today she continues to have a restrictive pattern on spirometry, although it should be noted that she is coughing with deep breathing. We had discussed performing full pulmonary function testing at previous visits, however she was unable to perform the full maneuver and ended up just doing spirometry instead. Her last chest CT was performed in 2004, as far I as can tell, and there were not abnormalities noted at that time.  Katie Gates remains on Humira, prednisone, hydroxychloroquine, and methotrexate. She is followed by Dr. Montez Morita, last visit on April 2018. She also has a history of GERD and is s/p a Nissen fundoplication. She is not on a PPI at this time, but she is on Bentyl.   Otherwise, there have been no changes to her past medical history, surgical history, family history, or social history.    Review of Systems: a 14-point review of systems is pertinent for what is mentioned in HPI.  Otherwise, all other systems were negative. Constitutional: negative other than that listed in the  HPI Eyes: negative other than that listed in the HPI Ears, nose, mouth, throat, and face: negative other than that listed in the HPI Respiratory: negative other than that listed in the HPI Cardiovascular: negative other than that listed in the HPI Gastrointestinal: negative other than that listed in the HPI Genitourinary: negative other than that listed in the HPI Integument: negative other than that listed in the HPI Hematologic: negative other than that listed in the HPI Musculoskeletal: negative other than that listed in the HPI Neurological: negative other than that listed in the HPI Allergy/Immunologic: negative other than that listed in the HPI    Objective:   Blood pressure 98/70, pulse 100, temperature 98.2 F (36.8 C), temperature source Oral,  resp. rate 18, height 5' 6.5" (1.689 m), weight 175 lb (79.4 kg), SpO2 98 %. Body mass index is 27.82 kg/m.   Physical Exam:  General: Alert, interactive, in no acute distress. She is able to speak in full sentences but does cough throughout the visit.  Eyes: No conjunctival injection present on the right, No conjunctival injection present on the left, PERRL bilaterally, No discharge on the right, No discharge on the left and No Horner-Trantas dots present Ears: Right TM pearly gray with normal light reflex, Left TM pearly gray with normal light reflex, Right TM intact without perforation and Left TM intact without perforation.  Nose/Throat: External nose within normal limits and septum midline, turbinates edematous with clear discharge, post-pharynx mildly erythematous without cobblestoning in the posterior oropharynx. Tonsils 2+ without exudates Neck: Supple without thyromegaly. Lungs: Decreased breath sounds with expiratory wheezing bilaterally. Increased work of breathing. No crackles noted. Trach in place without discharge.  CV: Normal S1/S2, no murmurs. Capillary refill <2 seconds.  Skin: Warm and dry, without lesions or rashes. Neuro:   Grossly intact. No focal deficits appreciated. Responsive to questions.   Diagnostic studies:  Spirometry: results abnormal (FEV1: 0.86/32%, FVC: 1.26/38%, FEV1/FVC: 68%).    Spirometry consistent with restrictive disease. Compared to the values obtained at the last visit, they are generally approximately 50% of her baseline. However she was coughing during the spirometry with deep inspirations. We did not give a treatment in the clinic, but we did administer a steroid injection to help control her inflammation.   Allergy Studies: none     Malachi Bonds, MD [facility] [place]

## 2016-12-21 NOTE — Patient Instructions (Addendum)
1. Severe persistent asthma - with decreased lung function today following a recent asthma exacerbation - We will work on getting the Spiriva approved instead of the Incruse Ellipta since the Spiriva was working better. - We will work on getting Xolair approved for every two weeks instead of monthly. - We will give a dose of injected steroid to help with improving you lung function and getting you over your current illness. - We will send in a prescription for DuoNeb nebulizer treatments which can be used instead of albuterol to help with opening up your lungs during respiratory flares.  - Daily controller medication(s): Symbicort 160/4.5 two puffs twice daily + Singulair 10mg  once daily + Spiriva two inhalations daily + Xolair monthly - Rescue medications: Proventil 4 puffs every 4-6 hours as needed, albuterol nebulizer one vial puffs every 4-6 hours as needed or DuoNeb nebulizer one vial every 4-6 hours as needed - Asthma control goals:  * Full participation in all desired activities (may need albuterol before activity) * Albuterol use two time or less a week on average (not counting use with activity) * Cough interfering with sleep two time or less a month * Oral steroids no more than once a year * No hospitalizations  2. Chronic urticaria - Continue with cetirizine 10mg  as needed.   - Continue hydroxyzine nightly.  - Hopefully increasing the Xolair to every two weeks will help control the itching and hives more.   3. Food allergies (beans, peanuts, onions, red dye) - EpiPen is up to date. - Continue to avoid all of your food allergy triggers.  4. Non-allergic rhinitis - with a recent environmental panel that was negative - Continue Atrovent as needed. - Continue Nasonex two sprays per nostril once daily.  5. Return in about 2 months (around 02/20/2017).  Please inform of any Emergency Department visits, hospitalizations, or changes in symptoms. Call 02/22/2017 before going to the ED for  breathing or allergy symptoms since we might be able to fit you in for a sick visit. Feel free to contact us anytime with any questions, problems, or concerns.  It was a pleasure to see you again today! Happy spring!   Websites that have reliable patient information: 1. American Academy of Asthma, Allergy, and Immunology: www.aaaai.org 2. Food Allergy Research and Education (FARE): foodallergy.org 3. Mothers of Asthmatics: http://www.asthmacommunitynetwork.org 4. American College of Allergy, Asthma, and Immunology: www.acaai.org

## 2016-12-24 ENCOUNTER — Telehealth: Payer: Self-pay

## 2016-12-24 NOTE — Progress Notes (Signed)
Will look into increase and let you know if insurance will approve. Audrea Bolte

## 2016-12-24 NOTE — Telephone Encounter (Signed)
Per Dellis Anes: (CC'd note in basket)  Hetty Ely, MDP [facility] Can we get a PA for Spiriva approval? She was doing much better on this before it was substituted for Incruse Ellipta due to insurance coverage earlier this year. Let me know.  Note in chart that Tammy is working on approval.

## 2017-01-04 ENCOUNTER — Ambulatory Visit (INDEPENDENT_AMBULATORY_CARE_PROVIDER_SITE_OTHER): Payer: Medicare Other | Admitting: *Deleted

## 2017-01-04 DIAGNOSIS — L500 Allergic urticaria: Secondary | ICD-10-CM

## 2017-01-04 MED ORDER — OMALIZUMAB 150 MG ~~LOC~~ SOLR
300.0000 mg | SUBCUTANEOUS | Status: DC
  Administered 2017-01-04 – 2017-03-13 (×6): 300 mg via SUBCUTANEOUS

## 2017-01-16 ENCOUNTER — Ambulatory Visit (INDEPENDENT_AMBULATORY_CARE_PROVIDER_SITE_OTHER): Payer: Medicare Other

## 2017-01-16 DIAGNOSIS — L500 Allergic urticaria: Secondary | ICD-10-CM

## 2017-01-17 ENCOUNTER — Ambulatory Visit: Payer: Self-pay

## 2017-01-30 ENCOUNTER — Ambulatory Visit (INDEPENDENT_AMBULATORY_CARE_PROVIDER_SITE_OTHER): Payer: Medicare Other

## 2017-01-30 DIAGNOSIS — L500 Allergic urticaria: Secondary | ICD-10-CM

## 2017-01-30 DIAGNOSIS — L501 Idiopathic urticaria: Secondary | ICD-10-CM

## 2017-02-13 ENCOUNTER — Ambulatory Visit (INDEPENDENT_AMBULATORY_CARE_PROVIDER_SITE_OTHER): Payer: Medicare Other

## 2017-02-13 DIAGNOSIS — L500 Allergic urticaria: Secondary | ICD-10-CM

## 2017-02-22 ENCOUNTER — Ambulatory Visit (INDEPENDENT_AMBULATORY_CARE_PROVIDER_SITE_OTHER): Payer: Medicare Other | Admitting: Allergy & Immunology

## 2017-02-22 ENCOUNTER — Encounter: Payer: Self-pay | Admitting: Allergy & Immunology

## 2017-02-22 VITALS — BP 118/66 | HR 92 | Temp 98.4°F | Resp 16

## 2017-02-22 DIAGNOSIS — T781XXD Other adverse food reactions, not elsewhere classified, subsequent encounter: Secondary | ICD-10-CM

## 2017-02-22 DIAGNOSIS — R942 Abnormal results of pulmonary function studies: Secondary | ICD-10-CM

## 2017-02-22 DIAGNOSIS — J4550 Severe persistent asthma, uncomplicated: Secondary | ICD-10-CM | POA: Diagnosis not present

## 2017-02-22 DIAGNOSIS — L501 Idiopathic urticaria: Secondary | ICD-10-CM | POA: Diagnosis not present

## 2017-02-22 DIAGNOSIS — M069 Rheumatoid arthritis, unspecified: Secondary | ICD-10-CM

## 2017-02-22 DIAGNOSIS — J383 Other diseases of vocal cords: Secondary | ICD-10-CM

## 2017-02-22 DIAGNOSIS — Z930 Tracheostomy status: Secondary | ICD-10-CM

## 2017-02-22 DIAGNOSIS — J310 Chronic rhinitis: Secondary | ICD-10-CM | POA: Diagnosis not present

## 2017-02-22 NOTE — Patient Instructions (Addendum)
1. Severe persistent asthma - with continued restrictive pattern on spirometry - Lung testing continued to look bad today, but overall your symptoms are actually stable.  - We will get a chest CT to look at your lung tissue. - We will refer you to [facility] for a second opinion regarding your lungs.  - Daily controller medication(s): Symbicort 160/4.5 two puffs twice daily + Singulair 10mg  once daily + Spiriva two inhalations daily + Xolair every two weeks - Rescue medications: Proventil 4 puffs every 4-6 hours as needed, albuterol nebulizer one vial puffs every 4-6 hours as needed or DuoNeb nebulizer one vial every 4-6 hours as needed - Asthma control goals:  * Full participation in all desired activities (may need albuterol before activity) * Albuterol use two time or less a week on average (not counting use with activity) * Cough interfering with sleep two time or less a month * Oral steroids no more than once a year * No hospitalizations  2. Chronic urticaria - controlled on Xolair - Continue with cetirizine 10mg  as needed.   - Continue hydroxyzine nightly.  - Hopefully increasing the Xolair to every two weeks will help control the itching and hives more.   3. Food allergies (beans, peanuts, onions, red dye) - EpiPen is up to date. - Continue to avoid all of your food allergy triggers.  4. Non-allergic rhinitis - with a recent environmental panel that was negative - Continue Atrovent as needed. - Continue Nasonex two sprays per nostril once daily. - Add Mucinex 600mg  twice daily to help thin out the mucous.  5. Return in about 2 months (around 04/25/2017).  Please inform of any Emergency Department visits, hospitalizations, or changes in symptoms. Call before going to the ED for breathing or allergy symptoms since we might be able to fit you in for a sick visit. Feel free to contact 04/27/2017 anytime with any questions, problems, or concerns.  It was a pleasure to see you again today!  Happy summer!   Websites that have reliable patient information: 1. American Academy of Asthma, Allergy, and Immunology: www.aaaai.org 2. Food Allergy Research and Education (FARE): foodallergy.org 3. Mothers of Asthmatics: http://www.asthmacommunitynetwork.org 4. American College of Allergy, Asthma, and Immunology: www.acaai.org

## 2017-02-22 NOTE — Progress Notes (Signed)
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 Severe persistent asthma - with continued worsening restrictive pattern on spirometry and increased productive mucous - Lung testing continued to look much worse today, but overall Ms. Zukowski's symptoms are actually stable and do not correlate with the spirometric findings.  - We will get a chest CT to evaluate for any evidence of interstitial lung disease.  - We will refer Ms. Montez Morita to [facility] for a second opinion regarding Ms. Silvester's pulmonary status.  - I feel that we are missing something and I would rather have another set of eyes on Ms. Biskner so that she is well cared for.  - Daily controller medication(s): Symbicort 160/4.5 two puffs twice daily + Singulair 10mg  once daily + Spiriva two inhalations daily + Xolair every two weeks - Rescue medications: Proventil 4 puffs every 4-6 hours as needed, albuterol nebulizer one vial puffs every 4-6 hours as needed or DuoNeb nebulizer one vial every 4-6 hours as needed - Asthma control goals:  * Full participation in all desired activities (may need albuterol before activity) * Albuterol use two time or less a week on average (not counting use with activity) * Cough interfering with sleep two time or less a month * Oral steroids no more than once a year * No  hospitalizations  2. Chronic urticaria - controlled on Xolair - Continue with cetirizine 10mg  as needed.   - Continue hydroxyzine nightly.  - Hopefully increasing the Xolair to every two weeks will help control the itching and hives more.   3. Food allergies (beans, peanuts, onions, red dye) - EpiPen is up to date. - Continue to avoid all of your food allergy triggers.  4. Non-allergic rhinitis - with a recent environmental panel that was negative and SPT positive to grass in 2013 - The respiratory allergen panel sent in May 2018 by her Pulmonologist is not valid since she was on Xolair at the time.  - Continue Atrovent as needed. - Continue Nasonex two sprays per nostril once daily. - I recommended adding Mucinex 600mg  - 1200mg  twice daily to help thin out the mucous.  5. Return in about 2 months (around 04/25/2017).   Subjective:   Alyssah Ware is a 51 y.o. female presenting today for follow up of  Chief Complaint  Patient presents with  . Allergic Rhinitis   . Asthma  . Urticaria    Jaleh Vant has a history of the following: Patient Active Problem List   Diagnosis Date Noted  . 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 Severe persistent asthma without complication 02/23/2017  . Idiopathic urticaria 02/23/2017  . Adverse food reaction 02/23/2017  . Non-allergic rhinitis 02/23/2017  . Abnormal biliary HIDA scan 06/21/2016  . Moderate persistent asthma 11/03/2015  . Allergic rhinitis due to pollen 11/03/2015  . Rheumatoid arthritis (HCC) 11/03/2015  . Tracheostomy care (HCC) 11/03/2015  . Allergy with anaphylaxis due to  food 11/03/2015  . 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 Adhesive bursitis of left shoulder 05/05/2014  . 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 Arthritis of left hip 01/22/2014    History obtained from: chart review and patient.  Airelle Everding was referred by Raynelle Jan., MD.     Meilani is a 51 y.o. female presenting for a follow  up visit. She has a complicated history including severe persistent asthma as well as rheumatoid arthritis that has affected her vocal cords, necessitating tracheostomy placement several years ago (July 2004). At the last visit in May 2018, she was having worsening asthma symptoms including shortness of breath. She had been changed from Spiriva Respimat to Incruse Ellipta due to insurance coverage. She did not feel that this was working as well as the [employer], therefore we filled out a PA to get the [place] approved. We continued her on Symbicort 160/4.5 two puffs BID as well as Singulair. Xolair was continued, which was approved for chronic urticaria. She was reporting using her albuterol on a daily basis, therefore to provide some additional bronchodilation activity we changed her to DuoNeb nebulizer treatments instead. Chronic urticaria was not well controlled on the monthly Xolair, therefore we changed her to twice monthly. Food allergies and rhinitis were well controlled.   Since the last visit, she did go to see her Pulmonologist - Dr. Su Monks - where she was seen by his Physician Assistant Helene Kelp. At that time, she had recently completed a course of azithromycin for tracheitis. He also had performed a workup including an HP panel including a Quantiferon Gold, hypersensitivity pneumonitis panel, and a fungal antibody panel which were all negative. He also sent a respiratory allergen panel, which did show multiple positives. However, she was on Xolair at the time, making results uninterpretable. She was doing well at the time, at least according to the note.    Asthma: Today she reports that her asthma has been under fairly good control, despite the spirometric findings today that continue to worsen. She remains on Symbicort 160/4.5 two puffs twice daily with a spacer with Spirva two inhalations once daily and Singulair 10mg  daily. Since increasing her Xolair for her hives, she does feel slightly better.  However, she continues to use her rescue inhalers at least once daily, often more. She has not needed any steroids since the last visit. We did give her an injection of steroids at the last visit, which she does report helped markedly. She has not needed antibiotics since the last visit. The heat seems to make her symptoms worse, and she has been spending more and more time indoors. The cold air also seems to bother her, so she essentially stays inside the majority of the year. As long as she remains indoors, she does tend to do well.   Rhinitis without postnasal drip and productive cough: She reports also today that she has been having increased mucous production that is "bubble gum" consistency. This occurs mostly during the day, but it will keep her up at night occasionally as well. She has not tried using Mucinex for this. Evidently she has had sputum samples collected which have all been unrevealing. The course of azithromycin a few months ago did not seem to help the mucous production (but did help with the tracheal discharge). Skin testing in 2013 was positive to a couple of grasses and was positive to dust mite only in 2001. In 2002, she had skin testing by Dr. 2003 that was positive  to dust mites, cat, [place] grass, grass mix, weed mix, ragweed, Trichophyton, birch, and maple (the note from June 2002 notes that they were "small reactions, but she ws incredibly itchy after the skin testing". Serum specific IgE panel in early 2018 prior to the initiation of Xolair was negative to the entire panel.   Food allergies (beans, peanuts, onions, red dye): She continues to avoid all of these without any accidental ingestions. In June 2006, she did have testing that was positive to tomato, soy, and wheat. However, it seems that she eats these now.   Sleep apnea: She does have a diagnosis of sleep apnea but does not use a CPAP. She is on O2 at night (currently 3L, increased from 2L one year ago).  Dr. Su Monks manages the O2. She does endorse daily fatigue, although she attributes this to her RA. Urticaria are improved with the increased dosing of Xolair. She does take one cetirizine daily, which in combination with the Xolair have kept the hives at bay.   Rheumatoid arthritis: Rheumatoid arthritis is well controlled on the current treatment regimen. She sees her Rheumatologist this coming Monday (July 23rd). She sees Dr. Mariah Milling every three months for follow ups. She does get intermittent increases in her her baseline prednisone, but remains at 2.5mg  for the most part. She does have right knee arthritic and went to see PA Odis Luster on June 25th. He prescribed Mobic and recommended evaluation by Dr. Leonel Ramsay for possible surgical intervention of the right knee. This appointment is pending.   Hoarseness with a history of esophageal dysmotility: She does endorse worsening hoarseness. She has GERD and is on Nexium and even has a fundoplication performed in 2005. At one point in 2004, she was on Nexium TID. She also has a history of GI dysmotility. She is unsure of the source of the hoarseness. She does have the history of vocal cord treatments with injections (presumably botulinum toxin) when she lived in [place], but she says that her vocal cords have not been checked recently. Her trach is followed by an ENT, but she only sees them once per year according to the patient.    Her pulmonary history goes back at least a couple of decades. She was followed by Dr. Cena Benton when she was receiving care at the [facility] [place] in the early 2000s. Review of his notes through Care Everywhere shows that he felt that she had vocal cord dysfunction as early as 2001, which was treated with buspirone for a period of time, although it is unclear whether . She was started on Serevent and Flovent in August 2001. She was changed to Advair 250/50 in December 2001, which was increased to 500/50 within the  next year. Even in 2002, per the notes, Dr. Otho Najjar felt that her symptoms were not entirely consistent with asthma. In April 2002, a chest CT was negative for interstitial lung disease.   It seems that she has been diagnosed with idiopathic anaphylaxis in during her time at [facility] under the care of Dr. Otho Najjar and Dr. Doreene Nest. She was worked up for mastocytosis and hereditary angioedema which was all normal. Triggers included peanuts and Hymenoptera stings. It was also felt that she had vocal cord dysfunction and she was prescribed nebulizer lidocaine for a period of time. She has also had a multitude of other triggers, including dust, perfumes, smells, cold air, and exercise.   Otherwise, there have been no changes to her past medical history, surgical history, family  history, or social history.    Review of Systems: a 14-point review of systems is pertinent for what is mentioned in HPI.  Otherwise, all other systems were negative. Constitutional: negative other than that listed in the HPI Eyes: negative other than that listed in the HPI Ears, nose, mouth, throat, and face: negative other than that listed in the HPI Respiratory: negative other than that listed in the HPI Cardiovascular: negative other than that listed in the HPI Gastrointestinal: negative other than that listed in the HPI Genitourinary: negative other than that listed in the HPI Integument: negative other than that listed in the HPI Hematologic: negative other than that listed in the HPI Musculoskeletal: negative other than that listed in the HPI Neurological: negative other than that listed in the HPI Allergy/Immunologic: negative other than that listed in the HPI    Objective:   Blood pressure 118/66, pulse 92, temperature 98.4 F (36.9 C), temperature source Oral, resp. rate 16, SpO2 97 %. There is no height or weight on file to calculate BMI.   Physical Exam:  General: Alert, interactive, in no acute distress.  Smiling. She actually is not coughing much during this visit compared to previous visits.  Eyes: No conjunctival injection present on the right, No conjunctival injection present on the left, PERRL bilaterally, No discharge on the right, No discharge on the left and No Horner-Trantas dots present Ears: Right TM pearly gray with normal light reflex, Left TM pearly gray with normal light reflex, Right TM intact without perforation and Left TM intact without perforation.  Nose/Throat: External nose within normal limits and septum midline, turbinates edematous with clear discharge, post-pharynx mildly erythematous without cobblestoning in the posterior oropharynx. Tonsils 2+ without exudates Neck:   Supple without thyromegaly. Lungs:            Decreased breath sounds with expiratory wheezing bilaterally. Increased work of breathing, but she is moving air even at the bases. No crackles noted. Trach in place without discharge.  CV:      Normal S1/S2, no murmurs. Capillary refill <2 seconds.  Skin:    Warm and dry, without lesions or rashes. Neuro:   Grossly intact. No focal deficits appreciated. Responsive to questions.   Diagnostic studies:   Spirometry: results abnormal (FEV1: 0.57/19%, FVC: 0.69/18%, FEV1/FVC: 83%).    Spirometry consistent with possible restrictive disease. Overall values are worse than previous visits.    Spirometry Values Over the Last 12 Months     Allergy Studies: none    Malachi Bonds, MD [facility] [place]

## 2017-02-23 DIAGNOSIS — J310 Chronic rhinitis: Secondary | ICD-10-CM | POA: Insufficient documentation

## 2017-02-23 DIAGNOSIS — T781XXA Other adverse food reactions, not elsewhere classified, initial encounter: Secondary | ICD-10-CM | POA: Insufficient documentation

## 2017-02-23 DIAGNOSIS — L501 Idiopathic urticaria: Secondary | ICD-10-CM | POA: Insufficient documentation

## 2017-02-23 DIAGNOSIS — J4550 Severe persistent asthma, uncomplicated: Secondary | ICD-10-CM | POA: Insufficient documentation

## 2017-02-23 DIAGNOSIS — R942 Abnormal results of pulmonary function studies: Secondary | ICD-10-CM | POA: Insufficient documentation

## 2017-02-27 ENCOUNTER — Ambulatory Visit (INDEPENDENT_AMBULATORY_CARE_PROVIDER_SITE_OTHER): Payer: Medicare Other

## 2017-02-27 ENCOUNTER — Ambulatory Visit: Payer: Self-pay

## 2017-02-27 DIAGNOSIS — L500 Allergic urticaria: Secondary | ICD-10-CM | POA: Diagnosis not present

## 2017-02-27 NOTE — Progress Notes (Signed)
I spent the weekend reviewing her extensive chart from her outside providers. I would like to get the chest CT before referring to [facility] Pulmonology. If the CT is normal, as it has been in the past, I do not think that the referral is needed. Can we make sure that the chest CT has been approved and scheduled?  Thanks!   Malachi Bonds, MD FAAAAI [facility] [place]

## 2017-03-01 ENCOUNTER — Ambulatory Visit (HOSPITAL_BASED_OUTPATIENT_CLINIC_OR_DEPARTMENT_OTHER)
Admission: RE | Admit: 2017-03-01 | Discharge: 2017-03-01 | Disposition: A | Discharge status: Home / Self Care | Payer: Medicare Other | Source: Ambulatory Visit | Attending: Allergy & Immunology | Admitting: Allergy & Immunology

## 2017-03-01 DIAGNOSIS — J4550 Severe persistent asthma, uncomplicated: Secondary | ICD-10-CM

## 2017-03-01 DIAGNOSIS — R942 Abnormal results of pulmonary function studies: Secondary | ICD-10-CM | POA: Diagnosis not present

## 2017-03-06 NOTE — Addendum Note (Signed)
Addended by: Alfonse Spruce on: 03/06/2017 01:39 PM   Modules accepted: Orders

## 2017-03-06 NOTE — Progress Notes (Signed)
Referrals placed for [facility] for trach management and Speech Therapy. Patient prefers [facility], as she has seen them previously (unknown provider). [facility] may have a speech therapy department that the ENT group works with. I will have our referral coordinator set that up and look into it.  Malachi Bonds, MD FAAAAI [facility] [place]

## 2017-03-07 ENCOUNTER — Telehealth: Payer: Self-pay

## 2017-03-07 NOTE — Telephone Encounter (Signed)
-----   Message from Alfonse Spruce, MD sent at 03/06/2017  1:39 PM EDT ----- Can you refer this patient to ENT and Speech? Thanks!

## 2017-03-07 NOTE — Telephone Encounter (Signed)
Referral request faxed to PCP, patient has Medicare and Medicaid.  Will follow up with PCP in a few days.  Thanks

## 2017-03-13 ENCOUNTER — Ambulatory Visit (INDEPENDENT_AMBULATORY_CARE_PROVIDER_SITE_OTHER): Payer: Medicare Other | Admitting: *Deleted

## 2017-03-13 DIAGNOSIS — L500 Allergic urticaria: Secondary | ICD-10-CM

## 2017-03-13 DIAGNOSIS — L501 Idiopathic urticaria: Secondary | ICD-10-CM

## 2017-03-20 NOTE — Telephone Encounter (Signed)
Noted. Thank you so much for taking care of that. Ms. Klump is a sweet patient and I am sure that she appreciates all of the work that you guys are doing for her.  Malachi Bonds, MD [facility] [place]

## 2017-03-20 NOTE — Telephone Encounter (Signed)
Spoke to [place] at [facility] Scheduled patient with ENT - Dr Charlean Merl 04/15/2017 @ 2:30 Scheduled patient with speech - Dr Thomasena Edis & Irving Burton (therapist) 04/23/17 @ 2:30pm Will call patient to let them of upcoming appts [facility] will mail out appt packets

## 2017-03-25 NOTE — Telephone Encounter (Signed)
Okay so are we leaving the referral as is for now??

## 2017-03-25 NOTE — Telephone Encounter (Addendum)
Ok I talked to him again and he said that the ENT who sees her for the speech referral at the [facility] could also manage the trach. So if she is referred to the [facility] (which it sounds like you took care of already), she will see a Doctor, general practice and a ENT (who will manage her trach and evaluate for vocal cord dysfunction).  Therefore, the referral Dr. Charlean Merl can be discontinued.  Thanks, Malachi Bonds, MD [facility] [place]

## 2017-03-25 NOTE — Telephone Encounter (Signed)
I received a message from Dr. Charlean Merl. He recommended that we refer to the [facility] instead of him to look at [facility]. But now that I look at the entire conversation of her referrals, it seems that we did want her to see Dr. Charlean Merl for trach management. I will send him a message back to confirm.   Malachi Bonds, MD [facility] [place]

## 2017-03-27 ENCOUNTER — Ambulatory Visit (INDEPENDENT_AMBULATORY_CARE_PROVIDER_SITE_OTHER): Payer: Medicare Other

## 2017-03-27 DIAGNOSIS — L508 Other urticaria: Secondary | ICD-10-CM

## 2017-03-27 DIAGNOSIS — L501 Idiopathic urticaria: Secondary | ICD-10-CM

## 2017-03-27 MED ORDER — OMALIZUMAB 150 MG ~~LOC~~ SOLR
300.0000 mg | SUBCUTANEOUS | Status: AC
  Administered 2017-03-27 – 2024-09-01 (×182): 300 mg via SUBCUTANEOUS

## 2017-04-10 ENCOUNTER — Ambulatory Visit (INDEPENDENT_AMBULATORY_CARE_PROVIDER_SITE_OTHER): Payer: Medicare Other | Admitting: *Deleted

## 2017-04-10 DIAGNOSIS — L501 Idiopathic urticaria: Secondary | ICD-10-CM | POA: Diagnosis not present

## 2017-04-24 ENCOUNTER — Ambulatory Visit (INDEPENDENT_AMBULATORY_CARE_PROVIDER_SITE_OTHER): Payer: Medicare Other

## 2017-04-24 DIAGNOSIS — L501 Idiopathic urticaria: Secondary | ICD-10-CM

## 2017-05-06 ENCOUNTER — Ambulatory Visit (INDEPENDENT_AMBULATORY_CARE_PROVIDER_SITE_OTHER): Payer: Medicare Other

## 2017-05-06 ENCOUNTER — Ambulatory Visit: Payer: Self-pay

## 2017-05-06 DIAGNOSIS — L500 Allergic urticaria: Secondary | ICD-10-CM | POA: Diagnosis not present

## 2017-05-20 ENCOUNTER — Ambulatory Visit: Payer: Self-pay

## 2017-05-23 ENCOUNTER — Ambulatory Visit (INDEPENDENT_AMBULATORY_CARE_PROVIDER_SITE_OTHER): Payer: Medicare Other | Admitting: *Deleted

## 2017-05-23 DIAGNOSIS — L500 Allergic urticaria: Secondary | ICD-10-CM | POA: Diagnosis not present

## 2017-05-23 DIAGNOSIS — J4540 Moderate persistent asthma, uncomplicated: Secondary | ICD-10-CM

## 2017-06-06 ENCOUNTER — Ambulatory Visit (INDEPENDENT_AMBULATORY_CARE_PROVIDER_SITE_OTHER): Payer: Medicare Other

## 2017-06-06 DIAGNOSIS — J4540 Moderate persistent asthma, uncomplicated: Secondary | ICD-10-CM | POA: Diagnosis not present

## 2017-06-20 ENCOUNTER — Ambulatory Visit (INDEPENDENT_AMBULATORY_CARE_PROVIDER_SITE_OTHER): Payer: Medicare Other

## 2017-06-20 DIAGNOSIS — L501 Idiopathic urticaria: Secondary | ICD-10-CM | POA: Diagnosis not present

## 2017-07-04 ENCOUNTER — Ambulatory Visit: Payer: Medicare Other

## 2017-07-08 ENCOUNTER — Other Ambulatory Visit: Payer: Self-pay | Admitting: Allergy & Immunology

## 2017-07-08 NOTE — Telephone Encounter (Signed)
received fax for 90 day supply for Symbicort 160. Patient was last seen 02/22/2017 and was to return in 4 months. Patient needs office visit.

## 2017-07-23 ENCOUNTER — Ambulatory Visit (INDEPENDENT_AMBULATORY_CARE_PROVIDER_SITE_OTHER): Payer: Medicare Other

## 2017-07-23 DIAGNOSIS — L500 Allergic urticaria: Secondary | ICD-10-CM | POA: Diagnosis not present

## 2017-08-07 ENCOUNTER — Ambulatory Visit (INDEPENDENT_AMBULATORY_CARE_PROVIDER_SITE_OTHER): Payer: Medicare Other

## 2017-08-07 DIAGNOSIS — L500 Allergic urticaria: Secondary | ICD-10-CM | POA: Diagnosis not present

## 2017-08-21 ENCOUNTER — Ambulatory Visit (INDEPENDENT_AMBULATORY_CARE_PROVIDER_SITE_OTHER): Payer: Medicare Other

## 2017-08-21 DIAGNOSIS — L500 Allergic urticaria: Secondary | ICD-10-CM

## 2017-09-04 ENCOUNTER — Ambulatory Visit (INDEPENDENT_AMBULATORY_CARE_PROVIDER_SITE_OTHER): Payer: Medicare Other

## 2017-09-04 DIAGNOSIS — L500 Allergic urticaria: Secondary | ICD-10-CM

## 2017-09-04 DIAGNOSIS — L501 Idiopathic urticaria: Secondary | ICD-10-CM

## 2017-09-18 ENCOUNTER — Ambulatory Visit (INDEPENDENT_AMBULATORY_CARE_PROVIDER_SITE_OTHER): Payer: Medicare Other | Admitting: *Deleted

## 2017-09-18 DIAGNOSIS — L500 Allergic urticaria: Secondary | ICD-10-CM

## 2017-10-02 ENCOUNTER — Ambulatory Visit (INDEPENDENT_AMBULATORY_CARE_PROVIDER_SITE_OTHER): Payer: Medicare Other

## 2017-10-02 DIAGNOSIS — L500 Allergic urticaria: Secondary | ICD-10-CM | POA: Diagnosis not present

## 2017-10-16 ENCOUNTER — Ambulatory Visit (INDEPENDENT_AMBULATORY_CARE_PROVIDER_SITE_OTHER): Payer: Medicare Other

## 2017-10-16 DIAGNOSIS — L500 Allergic urticaria: Secondary | ICD-10-CM

## 2017-10-31 ENCOUNTER — Ambulatory Visit: Payer: Self-pay

## 2017-11-01 ENCOUNTER — Telehealth: Payer: Self-pay

## 2017-11-01 NOTE — Telephone Encounter (Signed)
Pt. Calling to let us know the reason she didn't show up yesterday to receive her xolair injection after called [place] to mix it was because she has a tube coming out of nose a little kid pulled her tube out of her nose and her nose started bleeding and she had to go to the hospital to get it reinserted. Will send Tammy a message if we can use 2 samples of xolair to replace the already mix xolair we had to throw away yesterday.

## 2017-11-04 ENCOUNTER — Ambulatory Visit (INDEPENDENT_AMBULATORY_CARE_PROVIDER_SITE_OTHER): Payer: Medicare Other | Admitting: *Deleted

## 2017-11-04 DIAGNOSIS — J4540 Moderate persistent asthma, uncomplicated: Secondary | ICD-10-CM | POA: Diagnosis not present

## 2017-11-04 DIAGNOSIS — J309 Allergic rhinitis, unspecified: Secondary | ICD-10-CM

## 2017-11-04 NOTE — Telephone Encounter (Signed)
Spoke to [place] and she that would be fine to use 2 samples of the [employer].

## 2017-11-18 ENCOUNTER — Ambulatory Visit (INDEPENDENT_AMBULATORY_CARE_PROVIDER_SITE_OTHER): Payer: Medicare Other | Admitting: *Deleted

## 2017-11-18 DIAGNOSIS — L500 Allergic urticaria: Secondary | ICD-10-CM | POA: Diagnosis not present

## 2017-11-18 NOTE — Progress Notes (Signed)
Lf, cma

## 2017-11-27 ENCOUNTER — Other Ambulatory Visit: Payer: Self-pay | Admitting: Allergy & Immunology

## 2017-12-02 ENCOUNTER — Ambulatory Visit (INDEPENDENT_AMBULATORY_CARE_PROVIDER_SITE_OTHER): Payer: Medicare Other

## 2017-12-02 DIAGNOSIS — L500 Allergic urticaria: Secondary | ICD-10-CM

## 2017-12-11 ENCOUNTER — Other Ambulatory Visit: Payer: Self-pay | Admitting: Allergy & Immunology

## 2017-12-16 ENCOUNTER — Ambulatory Visit (INDEPENDENT_AMBULATORY_CARE_PROVIDER_SITE_OTHER): Payer: Medicare Other

## 2017-12-16 DIAGNOSIS — L500 Allergic urticaria: Secondary | ICD-10-CM | POA: Diagnosis not present

## 2017-12-31 ENCOUNTER — Other Ambulatory Visit: Payer: Self-pay | Admitting: Allergy & Immunology

## 2017-12-31 ENCOUNTER — Ambulatory Visit (INDEPENDENT_AMBULATORY_CARE_PROVIDER_SITE_OTHER): Payer: Medicare Other

## 2017-12-31 DIAGNOSIS — L500 Allergic urticaria: Secondary | ICD-10-CM

## 2018-01-01 NOTE — Telephone Encounter (Signed)
Gave 1 courtesy refill of nasonex. No additional refills until pts apt 01/13/18.

## 2018-01-07 IMAGING — CT CT CHEST HIGH RESOLUTION W/O CM
2 of 5 series · 15 of 36 positions shown, 18 images · non-contrast
Comparison: No priors.

CLINICAL DATA: 51-year-old female with history of asthma and
restrictive lung disease, worsening over the past 2-3 years,
particularly over the past 3-4 months.

EXAM:
CT CHEST WITHOUT CONTRAST
TECHNIQUE: Multidetector CT imaging of the chest was performed following the
standard protocol without intravenous contrast. High resolution
imaging of the lungs, as well as inspiratory and expiratory imaging,
was performed.

[Series 4: thorax · axial · 0.68mm/px · z∈[-250,-10]mm · 12 of 132 slices shown, 15 images]
[im 6/132  mediastinal]
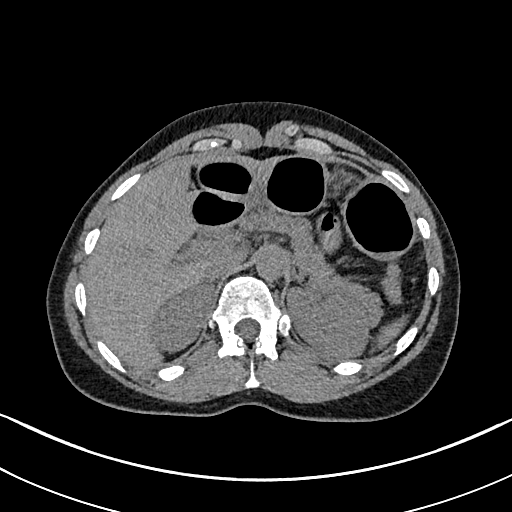
[im 6/132  lung]
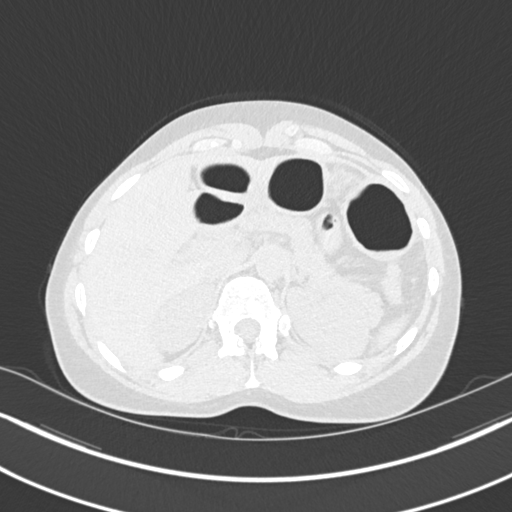
[im 18/132  lung]
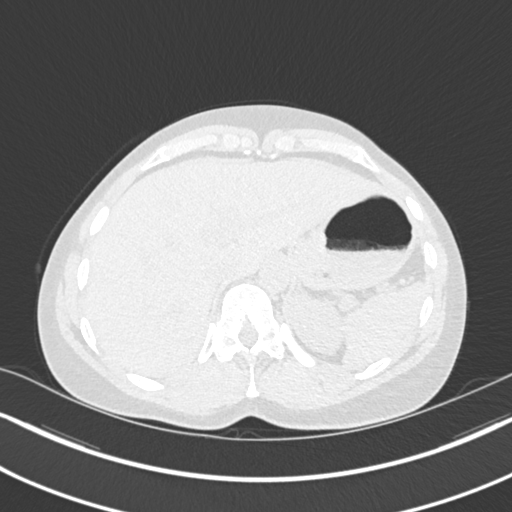
[im 30/132  lung]
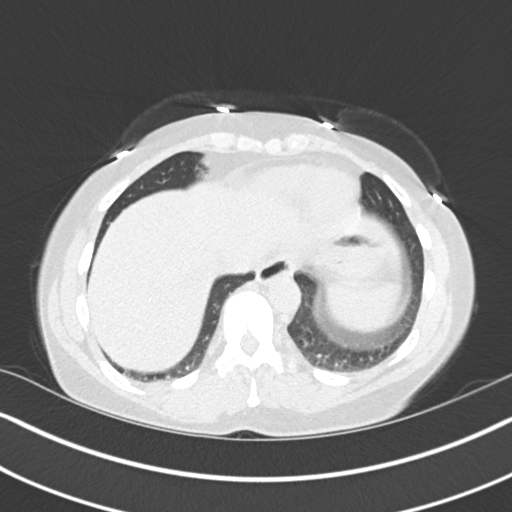
[im 42/132  lung]
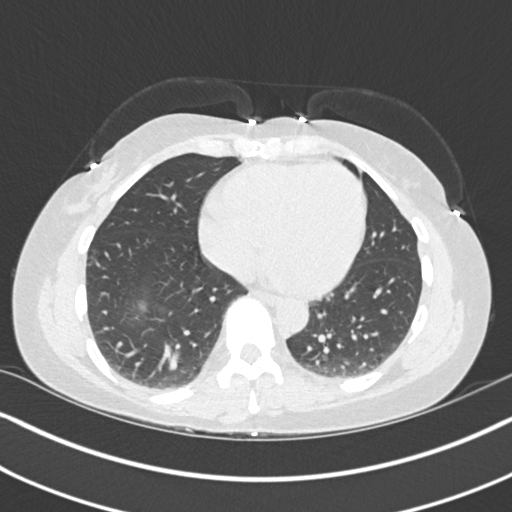
[im 48/132  mediastinal]
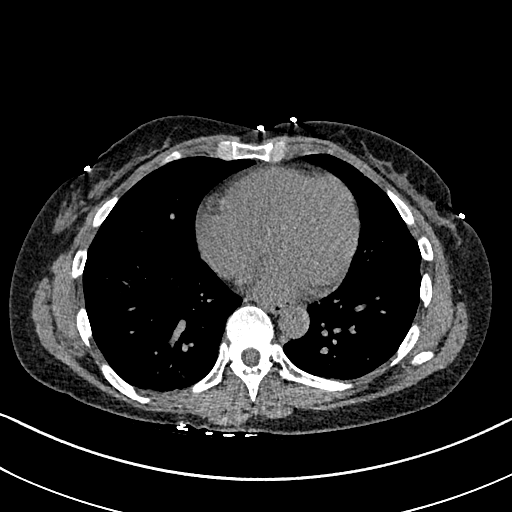
[im 48/132  lung]
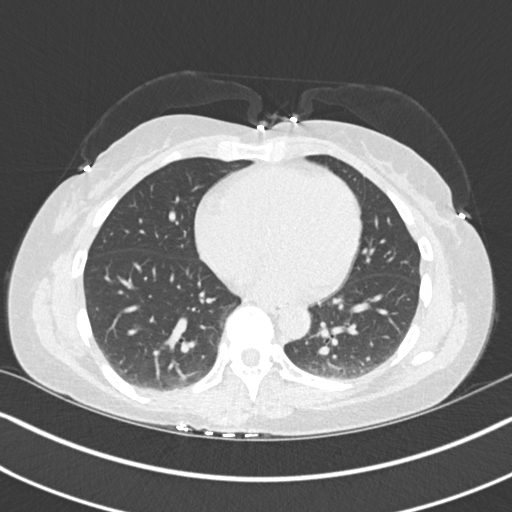
[im 60/132  lung]
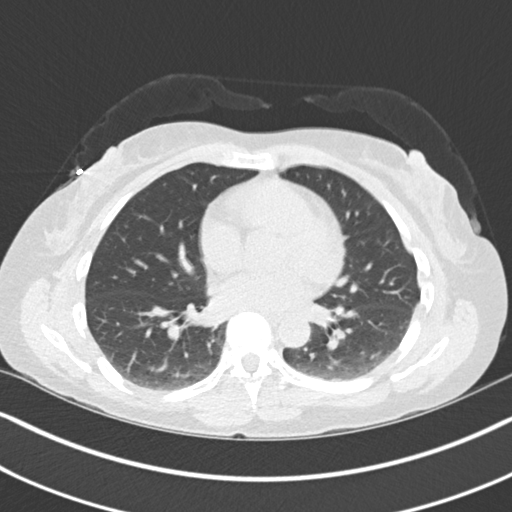
[im 72/132  lung]
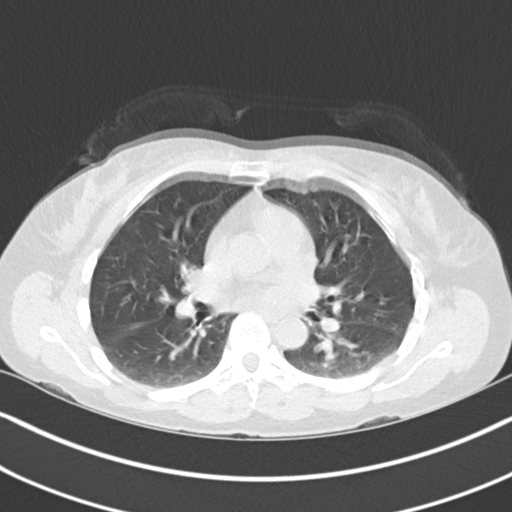
[im 84/132  lung]
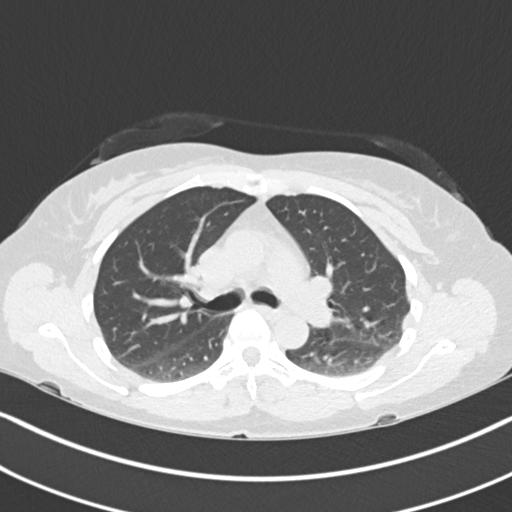
[im 90/132  mediastinal]
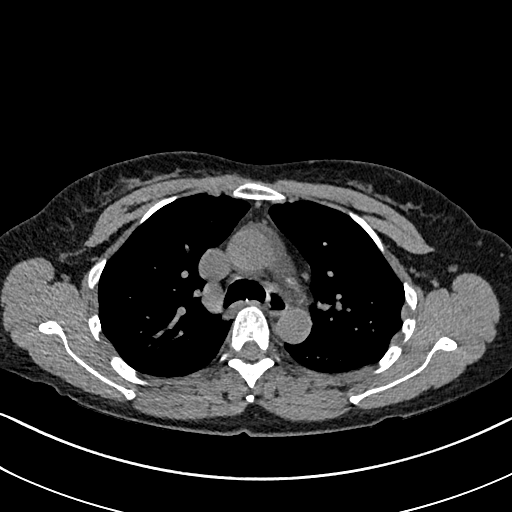
[im 90/132  lung]
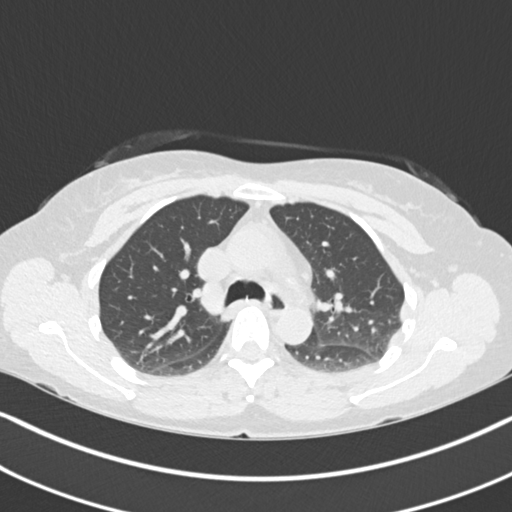
[im 102/132  lung]
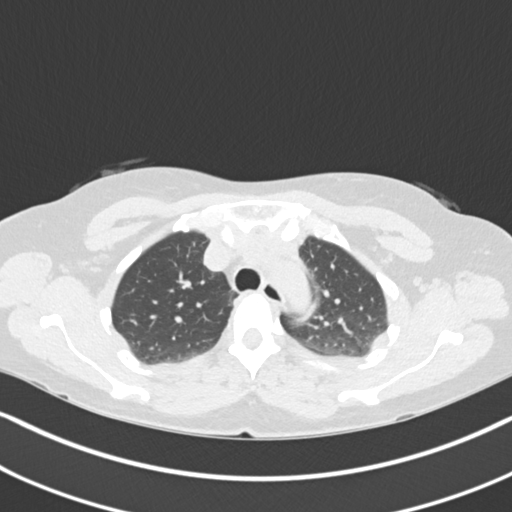
[im 114/132  lung]
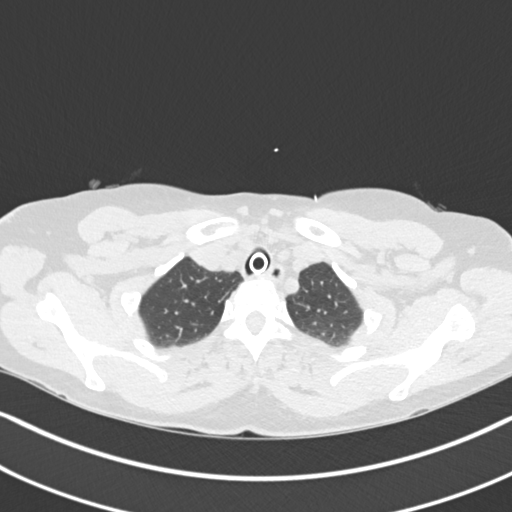
[im 126/132  lung]
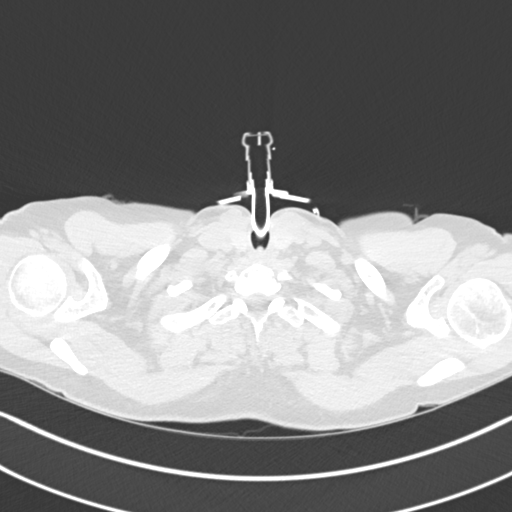

[Series 8: coronal · coronal · 0.56mm/px · 3 of 107 slices shown]
[im 22/107  lung]
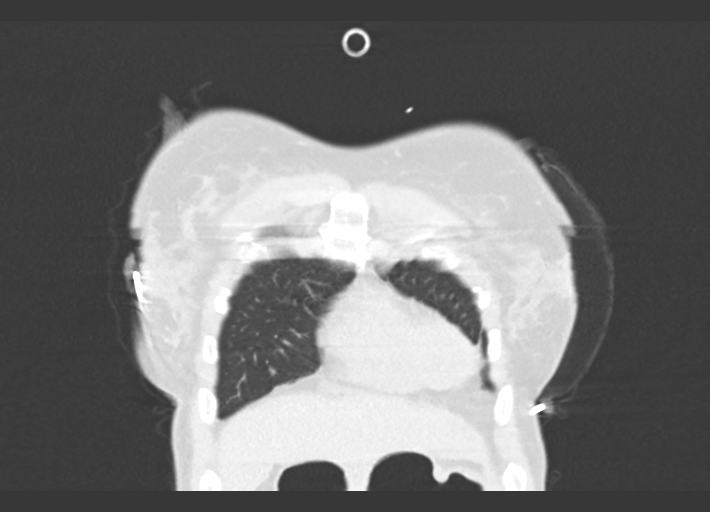
[im 43/107  lung]
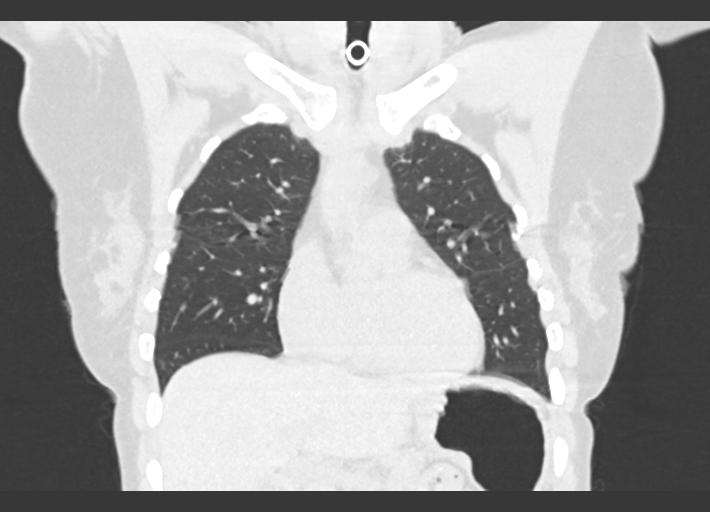
[im 64/107  lung]
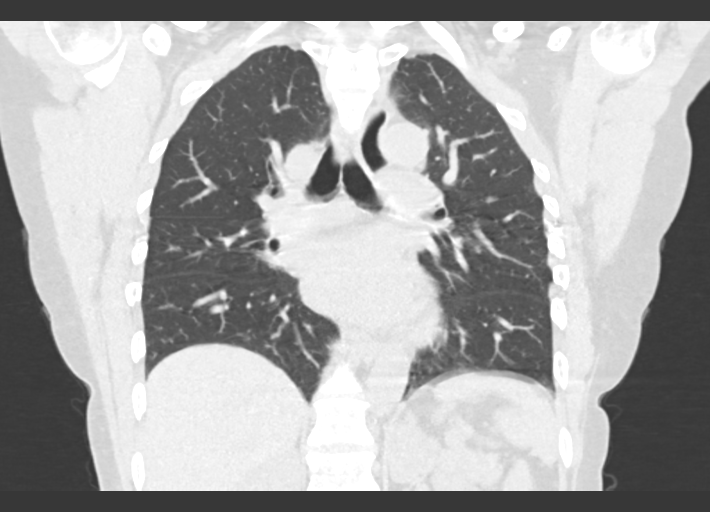

[15 of 36 positions shown; findings below may reference images not displayed]

FINDINGS: Cardiovascular: Heart size is normal. There is no significant
pericardial fluid, thickening or pericardial calcification. No
atherosclerotic calcifications in the thoracic aorta or the coronary
arteries.

Mediastinum/Nodes: Patient is intubated, with the tip of the
endotracheal 3.5 cm above the level of the carina. No pathologically
enlarged mediastinal are hilar lymph nodes. Please note that
accurate exclusion of hilar adenopathy is limited on noncontrast CT
scans. Esophagus is unremarkable in appearance. No axillary
lymphadenopathy.

Lungs/Pleura: High-resolution images demonstrate no significant
regions of ground-glass attenuation, subpleural reticulation,
parenchymal banding, traction bronchiectasis or frank honeycombing.
Inspiratory and expiratory imaging is unremarkable. No acute
consolidative airspace disease. No pleural effusions. No suspicious
appearing pulmonary nodules or masses.

Upper Abdomen: Status post cholecystectomy.

Musculoskeletal: There are no aggressive appearing lytic or blastic
lesions noted in the visualized portions of the skeleton.
IMPRESSION: 1. No evidence to suggest interstitial lung disease.
2. No acute findings in the thorax.

## 2018-01-13 ENCOUNTER — Ambulatory Visit (INDEPENDENT_AMBULATORY_CARE_PROVIDER_SITE_OTHER): Payer: Medicare Other

## 2018-01-13 DIAGNOSIS — L500 Allergic urticaria: Secondary | ICD-10-CM

## 2018-01-15 ENCOUNTER — Other Ambulatory Visit: Payer: Self-pay | Admitting: Allergy & Immunology

## 2018-01-16 ENCOUNTER — Other Ambulatory Visit: Payer: Self-pay | Admitting: Allergy & Immunology

## 2018-01-16 ENCOUNTER — Other Ambulatory Visit: Payer: Self-pay | Admitting: *Deleted

## 2018-01-20 ENCOUNTER — Other Ambulatory Visit: Payer: Self-pay | Admitting: Allergy & Immunology

## 2018-01-27 ENCOUNTER — Ambulatory Visit: Payer: Self-pay

## 2018-01-28 ENCOUNTER — Ambulatory Visit (INDEPENDENT_AMBULATORY_CARE_PROVIDER_SITE_OTHER): Payer: Medicare Other

## 2018-01-28 DIAGNOSIS — L500 Allergic urticaria: Secondary | ICD-10-CM | POA: Diagnosis not present

## 2018-02-06 ENCOUNTER — Other Ambulatory Visit: Payer: Self-pay | Admitting: Allergy & Immunology

## 2018-02-11 ENCOUNTER — Ambulatory Visit (INDEPENDENT_AMBULATORY_CARE_PROVIDER_SITE_OTHER): Payer: Medicare Other

## 2018-02-11 DIAGNOSIS — J4540 Moderate persistent asthma, uncomplicated: Secondary | ICD-10-CM

## 2018-02-25 ENCOUNTER — Ambulatory Visit (INDEPENDENT_AMBULATORY_CARE_PROVIDER_SITE_OTHER): Payer: Medicare Other

## 2018-02-25 DIAGNOSIS — J4540 Moderate persistent asthma, uncomplicated: Secondary | ICD-10-CM

## 2018-02-27 ENCOUNTER — Other Ambulatory Visit: Payer: Self-pay | Admitting: Allergy & Immunology

## 2018-02-27 NOTE — Telephone Encounter (Signed)
Courtesy refill  

## 2018-03-10 ENCOUNTER — Ambulatory Visit (INDEPENDENT_AMBULATORY_CARE_PROVIDER_SITE_OTHER): Payer: Medicare Other | Admitting: *Deleted

## 2018-03-10 DIAGNOSIS — L501 Idiopathic urticaria: Secondary | ICD-10-CM

## 2018-03-10 DIAGNOSIS — L508 Other urticaria: Secondary | ICD-10-CM

## 2018-03-24 ENCOUNTER — Ambulatory Visit (INDEPENDENT_AMBULATORY_CARE_PROVIDER_SITE_OTHER): Payer: Medicare Other | Admitting: *Deleted

## 2018-03-24 DIAGNOSIS — J4540 Moderate persistent asthma, uncomplicated: Secondary | ICD-10-CM | POA: Diagnosis not present

## 2018-04-03 ENCOUNTER — Other Ambulatory Visit: Payer: Self-pay | Admitting: Allergy & Immunology

## 2018-04-08 ENCOUNTER — Ambulatory Visit (INDEPENDENT_AMBULATORY_CARE_PROVIDER_SITE_OTHER): Payer: Medicare Other

## 2018-04-08 DIAGNOSIS — L501 Idiopathic urticaria: Secondary | ICD-10-CM

## 2018-04-08 DIAGNOSIS — J4540 Moderate persistent asthma, uncomplicated: Secondary | ICD-10-CM

## 2018-04-22 ENCOUNTER — Ambulatory Visit: Payer: Medicare Other

## 2018-04-28 ENCOUNTER — Ambulatory Visit (INDEPENDENT_AMBULATORY_CARE_PROVIDER_SITE_OTHER): Payer: Medicare Other | Admitting: *Deleted

## 2018-04-28 DIAGNOSIS — J4540 Moderate persistent asthma, uncomplicated: Secondary | ICD-10-CM

## 2018-05-12 ENCOUNTER — Ambulatory Visit (INDEPENDENT_AMBULATORY_CARE_PROVIDER_SITE_OTHER): Payer: Medicare Other

## 2018-05-12 DIAGNOSIS — J4540 Moderate persistent asthma, uncomplicated: Secondary | ICD-10-CM

## 2018-05-26 ENCOUNTER — Ambulatory Visit: Payer: Self-pay

## 2018-05-27 ENCOUNTER — Ambulatory Visit (INDEPENDENT_AMBULATORY_CARE_PROVIDER_SITE_OTHER): Payer: Medicare Other

## 2018-05-27 DIAGNOSIS — L501 Idiopathic urticaria: Secondary | ICD-10-CM | POA: Diagnosis not present

## 2018-06-10 ENCOUNTER — Ambulatory Visit (INDEPENDENT_AMBULATORY_CARE_PROVIDER_SITE_OTHER): Payer: Medicare Other

## 2018-06-10 DIAGNOSIS — J4540 Moderate persistent asthma, uncomplicated: Secondary | ICD-10-CM

## 2018-06-24 ENCOUNTER — Encounter: Payer: Self-pay | Admitting: Pediatrics

## 2018-06-24 ENCOUNTER — Ambulatory Visit (INDEPENDENT_AMBULATORY_CARE_PROVIDER_SITE_OTHER): Payer: Medicare Other

## 2018-06-24 ENCOUNTER — Ambulatory Visit: Payer: Self-pay

## 2018-06-24 ENCOUNTER — Ambulatory Visit (INDEPENDENT_AMBULATORY_CARE_PROVIDER_SITE_OTHER): Payer: Medicare Other | Admitting: Pediatrics

## 2018-06-24 VITALS — BP 98/66 | HR 95 | Temp 97.7°F | Resp 18 | Ht 67.5 in | Wt 184.4 lb

## 2018-06-24 DIAGNOSIS — M069 Rheumatoid arthritis, unspecified: Secondary | ICD-10-CM | POA: Diagnosis not present

## 2018-06-24 DIAGNOSIS — J4550 Severe persistent asthma, uncomplicated: Secondary | ICD-10-CM | POA: Diagnosis not present

## 2018-06-24 DIAGNOSIS — T7800XD Anaphylactic reaction due to unspecified food, subsequent encounter: Secondary | ICD-10-CM

## 2018-06-24 DIAGNOSIS — J301 Allergic rhinitis due to pollen: Secondary | ICD-10-CM

## 2018-06-24 DIAGNOSIS — Z430 Encounter for attention to tracheostomy: Secondary | ICD-10-CM | POA: Diagnosis not present

## 2018-06-24 DIAGNOSIS — J0100 Acute maxillary sinusitis, unspecified: Secondary | ICD-10-CM | POA: Insufficient documentation

## 2018-06-24 DIAGNOSIS — L501 Idiopathic urticaria: Secondary | ICD-10-CM

## 2018-06-24 MED ORDER — TIOTROPIUM BROMIDE MONOHYDRATE 2.5 MCG/ACT IN AERS: INHALATION_SPRAY | RESPIRATORY_TRACT | 3 refills | Status: DC

## 2018-06-24 MED ORDER — BUDESONIDE-FORMOTEROL FUMARATE 160-4.5 MCG/ACT IN AERO: 2.0000 | INHALATION_SPRAY | Freq: Two times a day (BID) | RESPIRATORY_TRACT | 3 refills | Status: DC

## 2018-06-24 MED ORDER — AMOXICILLIN-POT CLAVULANATE 875-125 MG PO TABS
ORAL_TABLET | ORAL | 0 refills | Status: DC
Start: 2018-06-24 — End: 2018-09-04

## 2018-06-24 MED ORDER — EPINEPHRINE 0.3 MG/0.3ML IJ SOAJ: INTRAMUSCULAR | 1 refills | Status: DC

## 2018-06-24 MED ORDER — ALBUTEROL SULFATE (2.5 MG/3ML) 0.083% IN NEBU: 2.5000 mg | INHALATION_SOLUTION | Freq: Four times a day (QID) | RESPIRATORY_TRACT | 1 refills | Status: DC | PRN

## 2018-06-24 MED ORDER — MONTELUKAST SODIUM 10 MG PO TABS: 10.0000 mg | ORAL_TABLET | Freq: Every day | ORAL | 3 refills | Status: DC

## 2018-06-24 MED ORDER — ALBUTEROL SULFATE HFA 108 (90 BASE) MCG/ACT IN AERS: INHALATION_SPRAY | RESPIRATORY_TRACT | 0 refills | Status: DC

## 2018-06-24 MED ORDER — MOMETASONE FUROATE 50 MCG/ACT NA SUSP: NASAL | 3 refills | Status: DC

## 2018-06-24 NOTE — Patient Instructions (Addendum)
Levocetirizine 5 mg-take 1 tablet once a day for runny nose or itchy eyes Nasonex 2 sprays per nostril once a day if needed for stuffy nose Symbicort 160-2 puffs every 12 hours to prevent coughing or wheezing Montelukast 10 mg-take 1 tablet once a day to prevent coughing or wheezing Ventolin 2 puffs every 4 hours if needed for wheezing or coughing spells or instead albuterol 0.083% 1 unit dose every 4 hours if needed Spiriva Respimat 2.5 MCG's-2 puffs every 24 hours to help the breathing Continue on Xolair 300 mg every 2 weeks Add Augmentin 875 mg-take 1 tablet every 12 hours for 10 days for infection, then resume erythromycin once a day Add prednisone 20 mg twice a day for 3 days, 20 mg on the fourth day, 10 mg on the fifth day  Continue on your other medications Keep your follow-up appointments with your pulmonologist Call us if you are not doing well on this treatment plan  Avoid peanuts, beans, onions, red dye.  If you have an allergic reaction take Benadryl 50 mg every 4 hours and if you have life-threatening symptoms inject with EpiPen 0.3 mg

## 2018-06-24 NOTE — Progress Notes (Signed)
[address] [place] [address] Dept: [phone]  FOLLOW UP NOTE  Patient ID: Katie Gates, female    DOB: [birth year]  Age: 52 y.o. MRN: [id number] Date of Office Visit: 06/24/2018  Assessment  Chief Complaint: Asthma  HPI Katie Gates presents for follow-up of asthma . allergic rhinitis.  She is on Xolair 300 mg every 2 weeks and feels that it has improved her symptoms significantly.  She has rheumatoid arthritis, allergies to beans, peanuts, onions and red dyes.  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  She is followed by Dr. Dayna Ramus for her pulmonary problems.  She would like refills on her medications.  During the past 2 weeks she has had a discolored postnasal drainage and some sinus pressure.  She is on levocetirizine 5 mg once a day and Nasonex 2 sprays per nostril once a day Many years ago she had an adverse reaction to Keflex but she has taken  penicillin-like medications without any problems since then.  She has impaired immune function and is on erythromycin once a day   Drug Allergies:  Allergies  Allergen Reactions  . Other Itching and Hives    Avoids corn kernels alone, can have corn as an ingredient. MM, RD 08/15/16  . Peanut-Containing Drug Products Anaphylaxis  . Red Dye Hives  . Bean Pod Extract Hives    Avoids kidney beans  . Fluticasone Other (See Comments)    NOSE BLEEDS  . Garlic Other (See Comments)    STARTS ASTHMA ATTACKS  . Keflex [Cephalexin]   . Onion Other (See Comments)    RAW ONION=Headache  . Robitussin (Alcohol Free) [Guaifenesin]   . Tomato Itching    Physical Exam: BP 98/66   Pulse 95   Temp 97.7 F (36.5 C) (Oral)   Resp 18   Ht 5' 7.5" (1.715 m)   Wt 184 lb 6.4 oz (83.6 kg)   SpO2 96%   BMI 28.45 kg/m    Physical Exam  Constitutional: She is oriented to person, place, and time. She appears well-developed and well-nourished.  HENT:  Eyes normal.  Ears normal.  Nose mild swelling of nasal  turbinates.  Pharynx normal except for yellow-green  Neck: Neck supple.  Cardiovascular:  S1-S2 normal no murmurs  Pulmonary/Chest:  She had decreased breath sounds and some rhonchi noted  Lymphadenopathy:    She has no cervical adenopathy.  Neurological: She is alert and oriented to person, place, and time.  Psychiatric: She has a normal mood and affect. Her behavior is normal. Judgment and thought content normal.  Vitals reviewed.   Diagnostics: FVC 1.79 L FEV1 1.40 L.  Predicted FVC 3.28 L predicted FEV1 2.61 L these values are stable for her  Assessment and Plan: 1. Severe persistent asthma without complication   2. Rheumatoid arthritis involving multiple sites, unspecified rheumatoid factor presence (HCC)   3. Tracheostomy care (HCC)   4. Anaphylactic shock due to food, subsequent encounter   5. Seasonal allergic rhinitis due to pollen   6. Acute non-recurrent maxillary sinusitis     Meds ordered this encounter  Medications  . EPINEPHrine (EPIPEN 2-PAK) 0.3 mg/0.3 mL IJ SOAJ injection    Sig: USE AS DIRECTED FOR SEVERE ALLERGIC REACTION    Dispense:  2 Device    Refill:  1  . montelukast (SINGULAIR) 10 MG tablet    Sig: Take 1 tablet (10 mg total) by mouth at bedtime.    Dispense:  90 tablet  Refill:  3  . albuterol (VENTOLIN HFA) 108 (90 Base) MCG/ACT inhaler    Sig: INHALE 2 PUFFS INTO THE LUNGS EVERY 4HOURS AS NEEDED FOR WHEEZING OR SHORTNESS OF BREATH.    Dispense:  18 Inhaler    Refill:  0  . mometasone (NASONEX) 50 MCG/ACT nasal spray    Sig: ONE SPRAY EACH NOSTRIL TWICE A DAY FOR NASAL CONGESTION OR DRAINAGE.    Dispense:  51 g    Refill:  3  . albuterol (PROVENTIL) (2.5 MG/3ML) 0.083% nebulizer solution    Sig: Take 3 mLs (2.5 mg total) by nebulization every 6 (six) hours as needed for wheezing or shortness of breath.    Dispense:  75 mL    Refill:  1  . budesonide-formoterol (SYMBICORT) 160-4.5 MCG/ACT inhaler    Sig: Inhale 2 puffs into the lungs 2  (two) times daily.    Dispense:  3 Inhaler    Refill:  3  . amoxicillin-clavulanate (AUGMENTIN) 875-125 MG tablet    Sig: Take 1 tablet every 12 hours for 10 days for infections    Dispense:  20 tablet    Refill:  0  . Tiotropium Bromide Monohydrate (SPIRIVA RESPIMAT) 2.5 MCG/ACT AERS    Sig: 2 puffs into the lungs daily    Dispense:  12 g    Refill:  3    Patient Instructions  Levocetirizine 5 mg-take 1 tablet once a day for runny nose or itchy eyes Nasonex 2 sprays per nostril once a day if needed for stuffy nose Symbicort 160-2 puffs every 12 hours to prevent coughing or wheezing Montelukast 10 mg-take 1 tablet once a day to prevent coughing or wheezing Ventolin 2 puffs every 4 hours if needed for wheezing or coughing spells or instead albuterol 0.083% 1 unit dose every 4 hours if needed Spiriva Respimat 2.5 MCG's-2 puffs every 24 hours to help the breathing Continue on Xolair 300 mg every 2 weeks Add Augmentin 875 mg-take 1 tablet every 12 hours for 10 days for infection, then resume erythromycin once a day Add prednisone 20 mg twice a day for 3 days, 20 mg on the fourth day, 10 mg on the fifth day  Continue on your other medications Keep your follow-up appointments with your pulmonologist Call us if you are not doing well on this treatment plan  Avoid peanuts, beans, onions, red dye.  If you have an allergic reaction take Benadryl 50 mg every 4 hours and if you have life-threatening symptoms inject with EpiPen 0.3 mg   Return in about 6 months (around 12/23/2018).    Thank you for the opportunity to care for this patient.  Please do not hesitate to contact me with questions.  Tonette Bihari, M.D.  [facility] [address] [place], [place] [address] [phone]

## 2018-07-02 ENCOUNTER — Other Ambulatory Visit: Payer: Self-pay | Admitting: Pediatrics

## 2018-07-05 ENCOUNTER — Other Ambulatory Visit: Payer: Self-pay | Admitting: Allergy & Immunology

## 2018-07-09 ENCOUNTER — Ambulatory Visit (INDEPENDENT_AMBULATORY_CARE_PROVIDER_SITE_OTHER): Payer: Medicare Other | Admitting: *Deleted

## 2018-07-09 DIAGNOSIS — L500 Allergic urticaria: Secondary | ICD-10-CM | POA: Diagnosis not present

## 2018-07-23 ENCOUNTER — Other Ambulatory Visit: Payer: Self-pay | Admitting: Allergy

## 2018-07-23 ENCOUNTER — Ambulatory Visit (INDEPENDENT_AMBULATORY_CARE_PROVIDER_SITE_OTHER): Payer: Medicare Other | Admitting: *Deleted

## 2018-07-23 DIAGNOSIS — J4540 Moderate persistent asthma, uncomplicated: Secondary | ICD-10-CM | POA: Diagnosis not present

## 2018-07-23 MED ORDER — EPINEPHRINE 0.3 MG/0.3ML IJ SOAJ: INTRAMUSCULAR | 1 refills | Status: DC

## 2018-08-01 ENCOUNTER — Other Ambulatory Visit: Payer: Self-pay | Admitting: Pediatrics

## 2018-08-07 ENCOUNTER — Ambulatory Visit (INDEPENDENT_AMBULATORY_CARE_PROVIDER_SITE_OTHER): Payer: Medicare Other

## 2018-08-07 DIAGNOSIS — L501 Idiopathic urticaria: Secondary | ICD-10-CM

## 2018-08-21 ENCOUNTER — Ambulatory Visit (INDEPENDENT_AMBULATORY_CARE_PROVIDER_SITE_OTHER): Payer: Medicare Other

## 2018-08-21 DIAGNOSIS — L501 Idiopathic urticaria: Secondary | ICD-10-CM

## 2018-09-02 ENCOUNTER — Other Ambulatory Visit: Payer: Self-pay | Admitting: *Deleted

## 2018-09-02 MED ORDER — OMALIZUMAB 150 MG ~~LOC~~ SOLR: SUBCUTANEOUS | 10 refills | Status: DC

## 2018-09-04 ENCOUNTER — Ambulatory Visit (INDEPENDENT_AMBULATORY_CARE_PROVIDER_SITE_OTHER): Payer: Medicare Other | Admitting: Allergy & Immunology

## 2018-09-04 ENCOUNTER — Encounter: Payer: Self-pay | Admitting: Allergy & Immunology

## 2018-09-04 ENCOUNTER — Ambulatory Visit: Payer: Self-pay

## 2018-09-04 VITALS — BP 134/76 | HR 86 | Temp 97.9°F | Resp 16

## 2018-09-04 DIAGNOSIS — J4550 Severe persistent asthma, uncomplicated: Secondary | ICD-10-CM | POA: Diagnosis not present

## 2018-09-04 DIAGNOSIS — T781XXD Other adverse food reactions, not elsewhere classified, subsequent encounter: Secondary | ICD-10-CM | POA: Diagnosis not present

## 2018-09-04 DIAGNOSIS — J310 Chronic rhinitis: Secondary | ICD-10-CM

## 2018-09-04 DIAGNOSIS — J0101 Acute recurrent maxillary sinusitis: Secondary | ICD-10-CM

## 2018-09-04 DIAGNOSIS — L508 Other urticaria: Secondary | ICD-10-CM | POA: Diagnosis not present

## 2018-09-04 DIAGNOSIS — M069 Rheumatoid arthritis, unspecified: Secondary | ICD-10-CM

## 2018-09-04 MED ORDER — LEVOFLOXACIN 500 MG PO TABS: 500.0000 mg | ORAL_TABLET | Freq: Every day | ORAL | 0 refills | Status: AC

## 2018-09-04 MED ORDER — TRIAMCINOLONE ACETONIDE 0.1 % EX OINT: 1.0000 "application " | TOPICAL_OINTMENT | Freq: Two times a day (BID) | CUTANEOUS | 1 refills | Status: AC

## 2018-09-04 NOTE — Progress Notes (Signed)
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 seems to be doing very well on her current regimen.  I have not seen her since 2018, although she has seen 1 of my partners in the interim.  She continues to get Xolair every 2 weeks and has been very stable on this regimen in combination with her inhalers.  There has been some disagreement about the presence of asthma, at [facility], but she continues to report that she feels better when she is on the Symbicort.  Her pulmonologist is happy with her current asthma regimen as well, so we will not make any changes at this time.  Her spirometry is much better than when I saw her at her last visit.  She is complaining of sinus pain and pressure today.  She is status post 1 course of Augmentin in November 2019, with minimal improvement at that time.  She reports facial pain bilaterally around the maxillary sinuses extending to the mastoid processes bilaterally.  She does endorse chronic congestion.  She is using all of her nasal sprays, and in particular she says the Atrovent works the best.  This typically is used for treating rhinorrhea, so she must have some component of rhinorrhea in combination with her congestion.  I did recommend that she try to decrease the use of her Atrovent, as it can cause overdrying.  We are going to treat with a round of Levaquin to see how she does with this.  If there  is no improvement we will order a sinus CT.  Plan/Recommendations:   1. Severe persistent asthma - with improved spirometry today - Lung testing continued to look bad today, but overall your symptoms are actually stable.  - We are not going to make any changes at all today.  - Daily controller medication(s): Symbicort 160/4.5 two puffs twice daily + Singulair 10mg  once daily + Spiriva two inhalations daily + Xolair every two weeks - Rescue medications: Proventil 4 puffs every 4-6 hours as needed, albuterol nebulizer one vial puffs every 4-6 hours as needed or DuoNeb nebulizer one vial every 4-6 hours as needed - Asthma control goals:  * Full participation in all desired activities (may need albuterol before activity) * Albuterol use two time or less a week on average (not counting use with activity) * Cough interfering with sleep two time or less a month * Oral steroids no more than once a year * No hospitalizations  2. Chronic urticaria - controlled on Xolair - Continue with cetirizine 10mg  as needed.   - Continue hydroxyzine nightly.   3. Food allergies (beans, peanuts, onions, red dye) - EpiPen is up to date. - Continue to avoid all of your food allergy triggers.  4. Non-allergic rhinitis - with overlying acute sinusitis - Continue Atrovent as needed. - Try adding on the nasal saline gel (Ayr is a good brand) - Start Levaquin 500mg  daily for 10 days. - For the ear itching: start triamcinolone 0.1% ointment applied  to a Q-tip to rub into the ear canal (DO NOT puncture your ear drums!).  - You can use the triamcinolone up to twice daily as needed for itching.   5. Return in about 6 months (around 03/05/2019).   Subjective:   Katie Gates is a 53 y.o. female presenting today for follow up of  Chief Complaint  Patient presents with  . Cough  . Nasal Congestion    Katie Gates has a history of the following: Patient Active Problem List   Diagnosis Date Noted  . Acute  non-recurrent maxillary sinusitis 06/24/2018  . 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 Severe persistent asthma without complication 02/23/2017  . Idiopathic urticaria 02/23/2017  . Adverse food reaction 02/23/2017  . Non-allergic rhinitis 02/23/2017  . Abnormal biliary HIDA scan 06/21/2016  . Moderate persistent asthma 11/03/2015  . Seasonal allergic rhinitis due to pollen 11/03/2015  . Rheumatoid arthritis (HCC) 11/03/2015  . Tracheostomy care (HCC) 11/03/2015  . Anaphylactic shock due to adverse food reaction 11/03/2015  . 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 Adhesive bursitis of left shoulder 05/05/2014  . 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 Arthritis of left hip 01/22/2014    History obtained from: chart review and patient.  Katie Gates Primary Care Provider is Spry, Geroge Baseman., MD.     Katie Gates is a 53 y.o. female presenting for a follow up visit.  I last saw her in July 2018.  At that time, her lung testing continued to look very terrible, but her clinical findings never fit her spirometric findings.  We did get a chest CT which was clear to rule out interstitial lung disease.  We also referred to [facility] for a second opinion regarding her pulmonary status.  We continued Symbicort 160/4.5 mcg twice daily plus Singulair 10 mg daily plus Spiriva 2 puffs daily and Xolair every 2 weeks.  She does have a history of multiple food allergies including beans, peanuts, onions, and red dye.  For her rhinitis, we continued Atrovent as well as Nasonex.  We also added on Mucinex to help thin her mucus.  She did go to see Dr. Lilyan Gilford, an otolaryngologist at [facility], in September 2018.  It was recommended that she see Dr. Thomasena Edis at the [facility].  It was also recommended that she drink water when she felt the urge to clear her throat, as the throat clearing was likely irritating her vocal cords.  It does not look like she ever saw Dr. Thomasena Edis, however.  She was  last seen in office in November 2019 by Dr. Beaulah Dinning.  At that time, she was continued on all of her medications.  She was also having marked congestion with purulent discharge.  She was started on Augmentin twice daily for 10 days and a prednisone taper.    Since the last visit, she has continued to have problems with sinus congestion and rhinorrhea. It is now to the point that her body is "not adjusting". She does yuse her treatment. She has tried using a humidifier and a dehumidifier. She is having nosebleeds. She is using her Atrovent which she says works well. She does not like the Nasacort. She is using Simply Saline.  She does report itching within the bilateral external auditory canals.  Per the patient, there is discussion about moving her down to a smaller cannula in her tracheostomy, but she refuses to do that.  This is recommended by the otolaryngologist at [facility].  Her primary otolaryngologist  in [place] prefers to keep things where they are.  The patient also prefers to keep things unchanged since she has a history of anaphylaxis with the emergent airway needs.  This has not really been a problem since she moved to [place], but the trauma and stress of this episode has clearly stayed with her.  Asthma/Respiratory Symptom History: She continues to see Helene Kelp, the PA who works with Dr. Su Monks.  She remains on Symbicort 160/4.5 mcg 2 puffs twice daily as well as Spiriva 2 puffs once daily and Xolair every 2 weeks. Sayre's asthma has been well controlled. She has not required rescue medication, experienced nocturnal awakenings due to lower respiratory symptoms, nor have activities of daily living been limited. She has required no Emergency Department or Urgent Care visits for her asthma. She has required zero courses of systemic steroids for asthma exacerbations since the last visit. ACT score today is 11, indicating subpar asthma symptom control.   She continues to  avoid all of her triggering foods.  Her EpiPen is up-to-date. Her RA is not under good control at this point. The changes in the temperature have truly thrown her for a loop. She is on a pain medication (hydrocodone). She does have some stomach pain with this but denies any urticaria.  Otherwise, there have been no changes to her past medical history, surgical history, family history, or social history.    Review of Systems: a 14-point review of systems is pertinent for what is mentioned in HPI.  Otherwise, all other systems were negative.  Constitutional: negative other than that listed in the HPI Eyes: negative other than that listed in the HPI Ears, nose, mouth, throat, and face: negative other than that listed in the HPI Respiratory: negative other than that listed in the HPI Cardiovascular: negative other than that listed in the HPI Gastrointestinal: negative other than that listed in the HPI Genitourinary: negative other than that listed in the HPI Integument: negative other than that listed in the HPI Hematologic: negative other than that listed in the HPI Musculoskeletal: negative other than that listed in the HPI Neurological: negative other than that listed in the HPI Allergy/Immunologic: negative other than that listed in the HPI    Objective:   Blood pressure 134/76, pulse 86, temperature 97.9 F (36.6 C), temperature source Oral, resp. rate 16, SpO2 98 %. There is no height or weight on file to calculate BMI.   Physical Exam:  General:Alert, interactive, in no acute distress.Smiling. Eyes: No conjunctival injection present on the right, No conjunctival injection present on the left, PERRL bilaterally, No discharge on the right, No discharge on the left and No Horner-Trantas dots present  Ears: Right TM pearly gray with normal light reflex, Left TM pearly gray with normal light reflex, Right TM intact without perforation and Left TM intact without perforation.    Nose/Throat: External nose within normal limits and septum midline, turbinates edematouswith clear discharge, post-pharynx mildly erythematouswithout cobblestoning in the posterior oropharynx. Tonsils 2+without exudates Neck:Supple without thyromegaly. Lungs:Excellent air movement throughout the lung fields. No increased work of breathing. No wheezing or crackles. She does have some coughing occasionally with inspiration. YB:RKVTXL S1/S2, no murmurs.Capillary refill <2 seconds.  Skin:Warm and dry, without lesions or rashes. Neuro: Grossly intact.No focal deficits appreciated. Responsive to questions.    Diagnostic studies:   Spirometry: results normal (FEV1: 1.91/73%, FVC: 2.29/70%, FEV1/FVC: 83%).    Spirometry consistent with possible restrictive disease.   Allergy Studies: none  Salvatore Marvel, MD  [facility] [place]

## 2018-09-04 NOTE — Patient Instructions (Addendum)
1. Severe persistent asthma - with improved spirometry today - Lung testing continued to look bad today, but overall your symptoms are actually stable.  - We are not going to make any changes at all today.  - Daily controller medication(s): Symbicort 160/4.5 two puffs twice daily + Singulair 10mg  once daily + Spiriva two inhalations daily + Xolair every two weeks - Rescue medications: Proventil 4 puffs every 4-6 hours as needed, albuterol nebulizer one vial puffs every 4-6 hours as needed or DuoNeb nebulizer one vial every 4-6 hours as needed - Asthma control goals:  * Full participation in all desired activities (may need albuterol before activity) * Albuterol use two time or less a week on average (not counting use with activity) * Cough interfering with sleep two time or less a month * Oral steroids no more than once a year * No hospitalizations  2. Chronic urticaria - controlled on Xolair - Continue with cetirizine 10mg  as needed.   - Continue hydroxyzine nightly.   3. Food allergies (beans, peanuts, onions, red dye) - EpiPen is up to date. - Continue to avoid all of your food allergy triggers.  4. Non-allergic rhinitis - with overlying acute sinusitis - Continue Atrovent as needed. - Try adding on the nasal saline gel (Ayr is a good brand) - Start Levaquin 500mg  daily for 10 days. - For the ear itching: start triamcinolone 0.1% ointment applied to a Q-tip to rub into the ear canal (DO NOT puncture your ear drums!).  - You can use the triamcinolone up to twice daily as needed for itching.     5. Return in about 6 months (around 03/05/2019).  Please inform us of any Emergency Department visits, hospitalizations, or changes in symptoms. Call us before going to the ED for breathing or allergy symptoms since we might be able to fit you in for a sick visit. Feel free to contact us anytime with any questions, problems, or concerns.  It was a pleasure to see you again today!  Websites  that have reliable patient information: 1. American Academy of Asthma, Allergy, and Immunology: www.aaaai.org 2. Food Allergy Research and Education (FARE): foodallergy.org 3. Mothers of Asthmatics: http://www.asthmacommunitynetwork.org 4. American College of Allergy, Asthma, and Immunology: www.acaai.org

## 2018-09-07 ENCOUNTER — Other Ambulatory Visit: Payer: Self-pay | Admitting: Allergy & Immunology

## 2018-09-17 ENCOUNTER — Ambulatory Visit (INDEPENDENT_AMBULATORY_CARE_PROVIDER_SITE_OTHER): Payer: Medicare Other

## 2018-09-17 DIAGNOSIS — J4550 Severe persistent asthma, uncomplicated: Secondary | ICD-10-CM

## 2018-09-17 DIAGNOSIS — L501 Idiopathic urticaria: Secondary | ICD-10-CM

## 2018-09-17 DIAGNOSIS — L508 Other urticaria: Secondary | ICD-10-CM

## 2018-10-01 ENCOUNTER — Ambulatory Visit (INDEPENDENT_AMBULATORY_CARE_PROVIDER_SITE_OTHER): Payer: Medicare Other | Admitting: *Deleted

## 2018-10-01 DIAGNOSIS — L500 Allergic urticaria: Secondary | ICD-10-CM | POA: Diagnosis not present

## 2018-10-03 ENCOUNTER — Other Ambulatory Visit: Payer: Self-pay | Admitting: Pediatrics

## 2018-10-07 ENCOUNTER — Telehealth: Payer: Self-pay | Admitting: *Deleted

## 2018-10-07 NOTE — Telephone Encounter (Signed)
Is incruse okay or do you want pt to stay on spiriva

## 2018-10-07 NOTE — Telephone Encounter (Signed)
[facility] called and stated that the formulary for Spiriva is changing.  Authorizations will term as of march 31st but they can switch her medication over to the Incruse. If you want her to stay on Spiriva, they will need a new authorization for the 2020 term year. Pharmacy would like a call back. Can speak with any representative.

## 2018-10-08 NOTE — Telephone Encounter (Signed)
We can change to Incruse, but I want to make sure that she knows how to use it.  Next time she comes in for Xolair, maybe we can give her a sample and teach her how to use it.  Can you put a note on her next Xolair injection so we know to do that?  Malachi Bonds, MD [facility]

## 2018-10-09 ENCOUNTER — Other Ambulatory Visit: Payer: Self-pay

## 2018-10-09 MED ORDER — UMECLIDINIUM BROMIDE 62.5 MCG/INH IN AEPB: 1.0000 | INHALATION_SPRAY | Freq: Every day | RESPIRATORY_TRACT | 5 refills | Status: DC

## 2018-10-09 NOTE — Telephone Encounter (Signed)
Will note it and send in rx and leave note for sample

## 2018-10-15 ENCOUNTER — Ambulatory Visit (INDEPENDENT_AMBULATORY_CARE_PROVIDER_SITE_OTHER): Payer: Medicare Other

## 2018-10-15 ENCOUNTER — Other Ambulatory Visit: Payer: Self-pay

## 2018-10-15 DIAGNOSIS — L500 Allergic urticaria: Secondary | ICD-10-CM

## 2018-10-21 ENCOUNTER — Other Ambulatory Visit: Payer: Self-pay | Admitting: Allergy & Immunology

## 2018-10-29 ENCOUNTER — Ambulatory Visit (INDEPENDENT_AMBULATORY_CARE_PROVIDER_SITE_OTHER): Payer: Medicare Other

## 2018-10-29 DIAGNOSIS — L500 Allergic urticaria: Secondary | ICD-10-CM

## 2018-11-05 ENCOUNTER — Other Ambulatory Visit: Payer: Self-pay | Admitting: Pediatrics

## 2018-11-12 ENCOUNTER — Ambulatory Visit (INDEPENDENT_AMBULATORY_CARE_PROVIDER_SITE_OTHER): Payer: Medicare Other | Admitting: *Deleted

## 2018-11-12 ENCOUNTER — Other Ambulatory Visit: Payer: Self-pay

## 2018-11-12 DIAGNOSIS — J4540 Moderate persistent asthma, uncomplicated: Secondary | ICD-10-CM

## 2018-11-24 ENCOUNTER — Other Ambulatory Visit: Payer: Self-pay

## 2018-11-24 MED ORDER — UMECLIDINIUM BROMIDE 62.5 MCG/INH IN AEPB: 1.0000 | INHALATION_SPRAY | Freq: Every day | RESPIRATORY_TRACT | 5 refills | Status: DC

## 2018-11-26 ENCOUNTER — Ambulatory Visit (INDEPENDENT_AMBULATORY_CARE_PROVIDER_SITE_OTHER): Payer: Medicare Other

## 2018-11-26 ENCOUNTER — Other Ambulatory Visit: Payer: Self-pay

## 2018-11-26 DIAGNOSIS — J4540 Moderate persistent asthma, uncomplicated: Secondary | ICD-10-CM

## 2018-12-02 ENCOUNTER — Other Ambulatory Visit: Payer: Self-pay

## 2018-12-02 ENCOUNTER — Other Ambulatory Visit: Payer: Self-pay | Admitting: Allergy & Immunology

## 2018-12-02 MED ORDER — ALBUTEROL SULFATE HFA 108 (90 BASE) MCG/ACT IN AERS: INHALATION_SPRAY | RESPIRATORY_TRACT | 1 refills | Status: DC

## 2018-12-05 ENCOUNTER — Other Ambulatory Visit: Payer: Self-pay | Admitting: Allergy & Immunology

## 2018-12-10 ENCOUNTER — Ambulatory Visit (INDEPENDENT_AMBULATORY_CARE_PROVIDER_SITE_OTHER): Payer: Medicare Other

## 2018-12-10 ENCOUNTER — Other Ambulatory Visit: Payer: Self-pay

## 2018-12-10 DIAGNOSIS — J4540 Moderate persistent asthma, uncomplicated: Secondary | ICD-10-CM | POA: Diagnosis not present

## 2018-12-24 ENCOUNTER — Other Ambulatory Visit: Payer: Self-pay

## 2018-12-24 ENCOUNTER — Ambulatory Visit (INDEPENDENT_AMBULATORY_CARE_PROVIDER_SITE_OTHER): Payer: Medicare Other

## 2018-12-24 DIAGNOSIS — J4540 Moderate persistent asthma, uncomplicated: Secondary | ICD-10-CM

## 2018-12-31 ENCOUNTER — Other Ambulatory Visit: Payer: Self-pay | Admitting: Pediatrics

## 2018-12-31 ENCOUNTER — Other Ambulatory Visit: Payer: Self-pay | Admitting: Allergy & Immunology

## 2019-01-07 ENCOUNTER — Other Ambulatory Visit: Payer: Self-pay

## 2019-01-07 ENCOUNTER — Ambulatory Visit (INDEPENDENT_AMBULATORY_CARE_PROVIDER_SITE_OTHER): Payer: Medicare Other

## 2019-01-07 DIAGNOSIS — J4540 Moderate persistent asthma, uncomplicated: Secondary | ICD-10-CM

## 2019-01-21 ENCOUNTER — Other Ambulatory Visit: Payer: Self-pay

## 2019-01-21 ENCOUNTER — Ambulatory Visit (INDEPENDENT_AMBULATORY_CARE_PROVIDER_SITE_OTHER): Payer: Medicare Other

## 2019-01-21 DIAGNOSIS — J4540 Moderate persistent asthma, uncomplicated: Secondary | ICD-10-CM

## 2019-02-04 ENCOUNTER — Ambulatory Visit (INDEPENDENT_AMBULATORY_CARE_PROVIDER_SITE_OTHER): Payer: Medicare Other | Admitting: *Deleted

## 2019-02-04 DIAGNOSIS — J4540 Moderate persistent asthma, uncomplicated: Secondary | ICD-10-CM

## 2019-02-18 ENCOUNTER — Other Ambulatory Visit: Payer: Self-pay

## 2019-02-18 ENCOUNTER — Ambulatory Visit (INDEPENDENT_AMBULATORY_CARE_PROVIDER_SITE_OTHER): Payer: Medicare Other

## 2019-02-18 DIAGNOSIS — J4540 Moderate persistent asthma, uncomplicated: Secondary | ICD-10-CM | POA: Diagnosis not present

## 2019-03-04 ENCOUNTER — Other Ambulatory Visit: Payer: Self-pay

## 2019-03-04 ENCOUNTER — Ambulatory Visit (INDEPENDENT_AMBULATORY_CARE_PROVIDER_SITE_OTHER): Payer: Medicare Other

## 2019-03-04 DIAGNOSIS — J4540 Moderate persistent asthma, uncomplicated: Secondary | ICD-10-CM

## 2019-03-10 ENCOUNTER — Other Ambulatory Visit: Payer: Self-pay | Admitting: Pediatrics

## 2019-03-10 MED ORDER — MONTELUKAST SODIUM 10 MG PO TABS: 10.0000 mg | ORAL_TABLET | Freq: Every day | ORAL | 0 refills | Status: DC

## 2019-03-10 NOTE — Telephone Encounter (Signed)
Sent courtesy refill. Pt needs ov for any additional refills.

## 2019-03-10 NOTE — Telephone Encounter (Signed)
PT called for montelukast refill sent to [facility], 90 day supply

## 2019-03-18 ENCOUNTER — Other Ambulatory Visit: Payer: Self-pay

## 2019-03-18 ENCOUNTER — Ambulatory Visit (INDEPENDENT_AMBULATORY_CARE_PROVIDER_SITE_OTHER): Payer: Medicare Other

## 2019-03-18 DIAGNOSIS — J4540 Moderate persistent asthma, uncomplicated: Secondary | ICD-10-CM | POA: Diagnosis not present

## 2019-04-01 ENCOUNTER — Other Ambulatory Visit: Payer: Self-pay | Admitting: Allergy & Immunology

## 2019-04-01 ENCOUNTER — Ambulatory Visit (INDEPENDENT_AMBULATORY_CARE_PROVIDER_SITE_OTHER): Payer: Medicare Other

## 2019-04-01 ENCOUNTER — Other Ambulatory Visit: Payer: Self-pay

## 2019-04-01 DIAGNOSIS — J4540 Moderate persistent asthma, uncomplicated: Secondary | ICD-10-CM | POA: Diagnosis not present

## 2019-04-01 NOTE — Telephone Encounter (Signed)
Sent I refill of albuterol hfa. Pt needs ov for additional refills.

## 2019-04-15 ENCOUNTER — Ambulatory Visit (INDEPENDENT_AMBULATORY_CARE_PROVIDER_SITE_OTHER): Payer: Medicare Other

## 2019-04-15 ENCOUNTER — Other Ambulatory Visit: Payer: Self-pay

## 2019-04-15 DIAGNOSIS — L501 Idiopathic urticaria: Secondary | ICD-10-CM

## 2019-04-15 DIAGNOSIS — J4540 Moderate persistent asthma, uncomplicated: Secondary | ICD-10-CM

## 2019-04-27 ENCOUNTER — Other Ambulatory Visit: Payer: Self-pay | Admitting: Pediatrics

## 2019-04-29 ENCOUNTER — Ambulatory Visit: Payer: Self-pay

## 2019-04-30 ENCOUNTER — Other Ambulatory Visit: Payer: Self-pay

## 2019-04-30 ENCOUNTER — Ambulatory Visit (INDEPENDENT_AMBULATORY_CARE_PROVIDER_SITE_OTHER): Payer: Medicare Other

## 2019-04-30 DIAGNOSIS — J4540 Moderate persistent asthma, uncomplicated: Secondary | ICD-10-CM | POA: Diagnosis not present

## 2019-05-03 ENCOUNTER — Other Ambulatory Visit: Payer: Self-pay | Admitting: Allergy & Immunology

## 2019-05-14 ENCOUNTER — Other Ambulatory Visit: Payer: Self-pay

## 2019-05-14 ENCOUNTER — Ambulatory Visit (INDEPENDENT_AMBULATORY_CARE_PROVIDER_SITE_OTHER): Payer: Medicare Other

## 2019-05-14 DIAGNOSIS — L500 Allergic urticaria: Secondary | ICD-10-CM

## 2019-05-28 ENCOUNTER — Ambulatory Visit (INDEPENDENT_AMBULATORY_CARE_PROVIDER_SITE_OTHER): Payer: Medicare Other

## 2019-05-28 DIAGNOSIS — L500 Allergic urticaria: Secondary | ICD-10-CM | POA: Diagnosis not present

## 2019-06-03 ENCOUNTER — Other Ambulatory Visit: Payer: Self-pay | Admitting: Pediatrics

## 2019-06-09 ENCOUNTER — Other Ambulatory Visit: Payer: Self-pay | Admitting: Pediatrics

## 2019-06-11 ENCOUNTER — Ambulatory Visit: Payer: Medicare Other

## 2019-06-11 ENCOUNTER — Ambulatory Visit (INDEPENDENT_AMBULATORY_CARE_PROVIDER_SITE_OTHER): Payer: Medicare Other

## 2019-06-11 ENCOUNTER — Other Ambulatory Visit: Payer: Self-pay

## 2019-06-11 DIAGNOSIS — J4540 Moderate persistent asthma, uncomplicated: Secondary | ICD-10-CM

## 2019-06-25 ENCOUNTER — Other Ambulatory Visit: Payer: Self-pay

## 2019-06-25 DIAGNOSIS — K208 Other esophagitis without bleeding: Secondary | ICD-10-CM

## 2019-06-29 ENCOUNTER — Other Ambulatory Visit: Payer: Self-pay | Admitting: Allergy & Immunology

## 2019-07-13 ENCOUNTER — Ambulatory Visit (INDEPENDENT_AMBULATORY_CARE_PROVIDER_SITE_OTHER): Payer: Medicare Other

## 2019-07-13 ENCOUNTER — Other Ambulatory Visit: Payer: Self-pay

## 2019-07-13 ENCOUNTER — Ambulatory Visit (INDEPENDENT_AMBULATORY_CARE_PROVIDER_SITE_OTHER): Payer: Medicare Other | Admitting: Allergy & Immunology

## 2019-07-13 ENCOUNTER — Encounter: Payer: Self-pay | Admitting: Allergy & Immunology

## 2019-07-13 VITALS — BP 100/70 | HR 104 | Temp 98.8°F | Resp 24 | Ht 68.0 in | Wt 191.8 lb

## 2019-07-13 DIAGNOSIS — T781XXD Other adverse food reactions, not elsewhere classified, subsequent encounter: Secondary | ICD-10-CM | POA: Diagnosis not present

## 2019-07-13 DIAGNOSIS — L501 Idiopathic urticaria: Secondary | ICD-10-CM

## 2019-07-13 DIAGNOSIS — L508 Other urticaria: Secondary | ICD-10-CM

## 2019-07-13 DIAGNOSIS — J4540 Moderate persistent asthma, uncomplicated: Secondary | ICD-10-CM

## 2019-07-13 DIAGNOSIS — J310 Chronic rhinitis: Secondary | ICD-10-CM

## 2019-07-13 MED ORDER — ALBUTEROL SULFATE HFA 108 (90 BASE) MCG/ACT IN AERS: INHALATION_SPRAY | RESPIRATORY_TRACT | 0 refills | Status: DC

## 2019-07-13 MED ORDER — MONTELUKAST SODIUM 10 MG PO TABS: ORAL_TABLET | ORAL | 3 refills | Status: DC

## 2019-07-13 NOTE — Patient Instructions (Addendum)
1. Severe persistent asthma - with recent COVID infection - We are going to continue with the current regimen today. - We are not going to make any changes today. - It could take a while to get back to your normal.  - Daily controller medication(s): Symbicort 160/4.5 two puffs twice daily + Singulair 10mg  once daily + Spiriva 2.74mcg one puff daily + Xolair every two weeks - Rescue medications: Proventil 4 puffs every 4-6 hours as needed, albuterol nebulizer one vial puffs every 4-6 hours as needed or DuoNeb nebulizer one vial every 4-6 hours as needed - Asthma control goals:  * Full participation in all desired activities (may need albuterol before activity) * Albuterol use two time or less a week on average (not counting use with activity) * Cough interfering with sleep two time or less a month * Oral steroids no more than once a year * No hospitalizations  2. Chronic urticaria - controlled on Xolair - Continue with cetirizine 10mg  as needed.   - Continue hydroxyzine nightly.   3. Food allergies (beans, peanuts, onions, red dye) - EpiPen is up to date. - Continue to avoid all of your food allergy triggers.  4. Non-allergic rhinitis - with overlying acute sinusitis - Continue Atrovent as needed.  5. Return in about 4 months (around 11/11/2019).  Please inform us of any Emergency Department visits, hospitalizations, or changes in symptoms. Call us before going to the ED for breathing or allergy symptoms since we might be able to fit you in for a sick visit. Feel free to contact us anytime with any questions, problems, or concerns.  It was a pleasure to see you again today!  Websites that have reliable patient information: 1. American Academy of Asthma, Allergy, and Immunology: www.aaaai.org 2. Food Allergy Research and Education (FARE): foodallergy.org 3. Mothers of Asthmatics: http://www.asthmacommunitynetwork.org 4. American College of Allergy, Asthma, and Immunology:  www.acaai.org

## 2019-07-13 NOTE — Progress Notes (Signed)
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 Severe persistent asthma - with recent COVID infection - We are going to continue with the current regimen today. - We are not going to make any changes today. - It could take a while to get back to your normal.  - Daily controller medication(s): Symbicort 160/4.5 two puffs twice daily + Singulair 10mg  once daily + Spiriva 2.50mcg one puff daily + Xolair every two weeks - Rescue medications: Proventil 4 puffs every 4-6 hours as needed, albuterol nebulizer one vial puffs every 4-6 hours as needed or DuoNeb nebulizer one vial every 4-6 hours as needed - Asthma control goals:  * Full participation in all desired activities (may need albuterol before activity) * Albuterol use two time or less a week on average (not counting use with activity) * Cough interfering with sleep two time or less a month * Oral steroids no more than once a year * No hospitalizations  2. Chronic urticaria - controlled on Xolair - Continue with cetirizine 10mg  as needed.   - Continue hydroxyzine nightly.   3. Food allergies (beans, peanuts, onions, red dye) - EpiPen is up to date. - Continue to avoid all of your food allergy triggers.  4. Non-allergic rhinitis - with overlying acute sinusitis - Continue Atrovent as needed.  5. Return in about 4 months (around 11/11/2019).  Subjective:    Katie Gates is a 53 y.o. female presenting today for follow up of  Chief Complaint  Patient presents with  . Asthma  . Wheezing    Katie Gates has a history of the following: Patient Active Problem List   Diagnosis Date Noted  . Acute non-recurrent maxillary sinusitis 06/24/2018  . 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 Severe persistent asthma without complication 95/04/3266  . Idiopathic urticaria 02/23/2017  . Adverse food reaction 02/23/2017  . Non-allergic rhinitis 02/23/2017  . Abnormal biliary HIDA scan 06/21/2016  . Moderate persistent asthma 11/03/2015  . Seasonal allergic rhinitis due to pollen 11/03/2015  . Rheumatoid arthritis ([place]) 11/03/2015  . 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 Anaphylactic shock due to adverse food reaction 11/03/2015  . 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 Adhesive bursitis of left shoulder 05/05/2014  . 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 Arthritis of left hip 01/22/2014    History obtained from: chart review and patient.  Katie Gates is a 53 y.o. female presenting for a follow up visit. She was last seen in January 2020. At that time, her lung testing looked stable. We continued with all of her medications, including Symbicort 160/4.5 two puffs BID as well as Spiriva and Singulair. We also kept her on Xolair. Her CIU was controlled with Xolair. Food allergies were stable. Her NAR was continued with Atrovent as needed. We also added on Levaquin for ten days due to a sinus infection.   Since the last visit, she has mostly done well. She was diagnosed with COVID around one month ago. She was in the hospital for four days. She was placed on  4L O2 but she is now recovered very well. She tells me that it "took something from [her]". She is short of breath often now.   She was placed on Spiriva 2.11mcg one puff once daily. She thinks that it has helped a lot. She just feels that she is unable to get any air movement. She has to take breaks  during her ADLs, but overall she is slowly improving.   Asthma/Respiratory Symptom History: She remains on the Symbicort two puffs BID as well as Spiriva and montelukast. She has not received her Xolair in around 6 weeks but would like to go ahead and restart it. She does have a lot of SOB, as above, but overall she is doing much better. She does have O2 from her Pulmonologist to have on hand for night time use.   Allergic Rhinitis Symptom History: She remains on her cetirizine 10mg  daily as well as the nasal spray. She does not use her nasal spray on a routine basis. She did get some "strong antibiotics" when she was hospitalized, but otherwise she has done well  Food Allergy Symptom History: She continues to avoid all of her triggering foods. She has had no accidental ingestions whatsoever. EpiPen is up to date.   Otherwise, there have been no changes to her past medical history, surgical history, family history, or social history.    Review of Systems  Constitutional: Positive for chills. Negative for fever, malaise/fatigue and weight loss.  HENT: Positive for sore throat. Negative for congestion, ear discharge and ear pain.   Eyes: Negative for pain, discharge and redness.  Respiratory: Positive for cough, shortness of breath and wheezing. Negative for sputum production.   Cardiovascular: Negative.  Negative for chest pain and palpitations.  Gastrointestinal: Negative for abdominal pain, constipation, diarrhea, heartburn, nausea and vomiting.  Skin: Negative.  Negative for itching and rash.  Neurological: Negative for dizziness and headaches.  Endo/Heme/Allergies: Negative for environmental allergies. Does not bruise/bleed easily.       Objective:   Blood pressure 100/70, pulse (!) 104, temperature 98.8 F (37.1 C), temperature source Oral, resp. rate (!) 24, height 5\' 8"  (1.727 m), weight 191 lb 12.8 oz (87 kg), SpO2 98 %. Body mass index is 29.16 kg/m.   Physical Exam:   Physical Exam  Constitutional: She appears well-developed.  Seems more tired than previous times that I have seen her.  HENT:  Head: Normocephalic and atraumatic.  Right Ear: Tympanic membrane, external ear and ear canal normal. No drainage, swelling or tenderness. Tympanic membrane is not injected, not scarred, not erythematous, not retracted and not bulging.  Left Ear: Tympanic membrane, external ear and ear canal normal. No drainage, swelling or tenderness. Tympanic membrane is not injected, not scarred, not erythematous, not retracted and not bulging.  Nose: No mucosal edema, rhinorrhea, nasal deformity or septal deviation. No epistaxis. Right sinus exhibits no maxillary sinus tenderness and no frontal sinus tenderness. Left sinus exhibits no maxillary sinus tenderness and no frontal sinus tenderness.  Mouth/Throat: Uvula is midline and oropharynx is clear and moist. Mucous membranes are not pale and not dry.  Eyes: Pupils are equal, round, and reactive to light. Conjunctivae and EOM are normal. Right eye exhibits no chemosis and no discharge. Left eye exhibits no chemosis and no discharge. Right conjunctiva is not injected. Left conjunctiva is not injected.  Cardiovascular: Normal rate, regular rhythm and normal heart sounds.  Respiratory: Effort normal and breath sounds normal. No accessory muscle usage. No tachypnea. No respiratory  distress. She has no wheezes. She has no rhonchi. She has no rales. She exhibits no tenderness.  Coughing with inspiration, although she breathes comfortably if she is not breathing deeply. She does have her trach in place.   GI: There is no abdominal tenderness. There is no rebound and no guarding.  Lymphadenopathy:       Head (right side): No submandibular, no tonsillar and no occipital adenopathy present.       Head (left side): No submandibular, no tonsillar and no occipital adenopathy present.    She has no cervical adenopathy.  Neurological: She is alert.   Skin: No abrasion, no petechiae and no rash noted. Rash is not papular, not vesicular and not urticarial. No erythema. No pallor.  Psychiatric: She has a normal mood and affect.     Diagnostic studies: none      Malachi Bonds, MD  [facility]

## 2019-07-27 ENCOUNTER — Ambulatory Visit (INDEPENDENT_AMBULATORY_CARE_PROVIDER_SITE_OTHER): Payer: Medicare Other

## 2019-07-27 ENCOUNTER — Other Ambulatory Visit: Payer: Self-pay

## 2019-07-27 DIAGNOSIS — J4540 Moderate persistent asthma, uncomplicated: Secondary | ICD-10-CM | POA: Diagnosis not present

## 2019-08-03 ENCOUNTER — Other Ambulatory Visit: Payer: Self-pay | Admitting: Allergy & Immunology

## 2019-08-05 ENCOUNTER — Other Ambulatory Visit: Payer: Self-pay | Admitting: Allergy & Immunology

## 2019-08-10 ENCOUNTER — Ambulatory Visit (INDEPENDENT_AMBULATORY_CARE_PROVIDER_SITE_OTHER): Payer: Medicare Other

## 2019-08-10 ENCOUNTER — Other Ambulatory Visit: Payer: Self-pay

## 2019-08-10 DIAGNOSIS — J4540 Moderate persistent asthma, uncomplicated: Secondary | ICD-10-CM

## 2019-08-19 ENCOUNTER — Telehealth: Payer: Self-pay

## 2019-08-19 NOTE — Telephone Encounter (Signed)
Received a fax from [facility].  Need a refill on Incruse Ellipta 62.5 mcg inhaler.  Per their records, patient has had this filled on a monthly basis since 10/07/2018.   Called patient.  Patient did inform Dr. Dellis Anes she was on Spiriva at her OV on 07/13/2019.  Patient went to her medication box.  She verified she has actually been on Incruse Ellipta since March 2020 at one puff daily.   Spiriva was discontinued on 10/07/2018 per telephone contact note in Epic as Spiriva is not covered by patients insurance.  Incruse is the covered alternative.  Per chart OV note on 07/13/2019, Dr. Nino Glow note states:  - Daily controller medication(s): Symbicort 160/4.5 two puffs twice daily + Singulair 10mg  once daily + Spiriva 2.69mcg one puff daily + Xolair every two weeks    Wanted to make Dr. 4m aware of correct medication.  Spiriva has been discontinued since 10/07/2018 and patient has been on Incruse Ellipta 62.5 mcg daily as on 10/07/2018.  NOTE:  Okayed refill on Incruse Ellipta 62.5 mcg x 6 refills to 12/07/2018.

## 2019-08-20 NOTE — Telephone Encounter (Signed)
Sounds good.  I am fine with that plan.  Malachi Bonds, MD [facility]

## 2019-08-24 ENCOUNTER — Ambulatory Visit (INDEPENDENT_AMBULATORY_CARE_PROVIDER_SITE_OTHER): Payer: Medicare Other

## 2019-08-24 DIAGNOSIS — L501 Idiopathic urticaria: Secondary | ICD-10-CM | POA: Diagnosis not present

## 2019-08-24 DIAGNOSIS — J4540 Moderate persistent asthma, uncomplicated: Secondary | ICD-10-CM

## 2019-08-25 ENCOUNTER — Other Ambulatory Visit: Payer: Self-pay

## 2019-09-07 ENCOUNTER — Ambulatory Visit (INDEPENDENT_AMBULATORY_CARE_PROVIDER_SITE_OTHER): Payer: Medicare Other

## 2019-09-07 ENCOUNTER — Other Ambulatory Visit: Payer: Self-pay

## 2019-09-07 DIAGNOSIS — J4540 Moderate persistent asthma, uncomplicated: Secondary | ICD-10-CM | POA: Diagnosis not present

## 2019-09-16 ENCOUNTER — Other Ambulatory Visit: Payer: Self-pay

## 2019-09-16 MED ORDER — MOMETASONE FUROATE 50 MCG/ACT NA SUSP: 2.0000 | Freq: Every day | NASAL | 2 refills | Status: DC

## 2019-09-20 ENCOUNTER — Other Ambulatory Visit: Payer: Self-pay | Admitting: Allergy & Immunology

## 2019-09-21 ENCOUNTER — Other Ambulatory Visit: Payer: Self-pay

## 2019-09-21 ENCOUNTER — Ambulatory Visit (INDEPENDENT_AMBULATORY_CARE_PROVIDER_SITE_OTHER): Payer: Medicare Other

## 2019-09-21 DIAGNOSIS — J4540 Moderate persistent asthma, uncomplicated: Secondary | ICD-10-CM

## 2019-09-24 ENCOUNTER — Other Ambulatory Visit: Payer: Self-pay | Admitting: Allergy & Immunology

## 2019-10-05 ENCOUNTER — Other Ambulatory Visit: Payer: Self-pay

## 2019-10-05 ENCOUNTER — Ambulatory Visit (INDEPENDENT_AMBULATORY_CARE_PROVIDER_SITE_OTHER): Payer: Medicare Other

## 2019-10-05 DIAGNOSIS — J4540 Moderate persistent asthma, uncomplicated: Secondary | ICD-10-CM

## 2019-10-19 ENCOUNTER — Ambulatory Visit: Payer: Self-pay

## 2019-10-27 ENCOUNTER — Other Ambulatory Visit: Payer: Self-pay

## 2019-10-27 ENCOUNTER — Ambulatory Visit (INDEPENDENT_AMBULATORY_CARE_PROVIDER_SITE_OTHER): Payer: Medicare Other | Admitting: *Deleted

## 2019-10-27 DIAGNOSIS — L501 Idiopathic urticaria: Secondary | ICD-10-CM

## 2019-11-10 ENCOUNTER — Other Ambulatory Visit: Payer: Self-pay

## 2019-11-10 ENCOUNTER — Ambulatory Visit (INDEPENDENT_AMBULATORY_CARE_PROVIDER_SITE_OTHER): Payer: Medicare Other

## 2019-11-10 DIAGNOSIS — L501 Idiopathic urticaria: Secondary | ICD-10-CM | POA: Diagnosis not present

## 2019-11-24 ENCOUNTER — Ambulatory Visit (INDEPENDENT_AMBULATORY_CARE_PROVIDER_SITE_OTHER): Payer: Medicare Other | Admitting: *Deleted

## 2019-11-24 ENCOUNTER — Other Ambulatory Visit: Payer: Self-pay

## 2019-11-24 DIAGNOSIS — L501 Idiopathic urticaria: Secondary | ICD-10-CM | POA: Diagnosis not present

## 2019-12-08 ENCOUNTER — Ambulatory Visit (INDEPENDENT_AMBULATORY_CARE_PROVIDER_SITE_OTHER): Payer: Medicare Other

## 2019-12-08 ENCOUNTER — Other Ambulatory Visit: Payer: Self-pay

## 2019-12-08 DIAGNOSIS — L501 Idiopathic urticaria: Secondary | ICD-10-CM

## 2019-12-22 ENCOUNTER — Ambulatory Visit (INDEPENDENT_AMBULATORY_CARE_PROVIDER_SITE_OTHER): Payer: Medicare Other

## 2019-12-22 ENCOUNTER — Other Ambulatory Visit: Payer: Self-pay

## 2019-12-22 DIAGNOSIS — L501 Idiopathic urticaria: Secondary | ICD-10-CM

## 2020-01-06 ENCOUNTER — Ambulatory Visit: Payer: Self-pay

## 2020-01-21 ENCOUNTER — Ambulatory Visit (INDEPENDENT_AMBULATORY_CARE_PROVIDER_SITE_OTHER): Payer: Medicare (Managed Care)

## 2020-01-21 ENCOUNTER — Other Ambulatory Visit: Payer: Self-pay

## 2020-01-21 DIAGNOSIS — L501 Idiopathic urticaria: Secondary | ICD-10-CM | POA: Diagnosis not present

## 2020-01-27 ENCOUNTER — Telehealth: Payer: Self-pay

## 2020-01-27 NOTE — Telephone Encounter (Signed)
Faxed in a courtesy refill for symbicort 160 mcg one time only.  While on the phone with the pt. I scheduled her With Dr. Dellis Anes tomorrow at 3:45 pm pt. Aware.

## 2020-01-28 ENCOUNTER — Ambulatory Visit (INDEPENDENT_AMBULATORY_CARE_PROVIDER_SITE_OTHER): Payer: Medicare (Managed Care) | Admitting: Family Medicine

## 2020-01-28 ENCOUNTER — Encounter: Payer: Self-pay | Admitting: Family Medicine

## 2020-01-28 VITALS — BP 110/80 | HR 94 | Temp 98.1°F | Resp 28 | Ht 67.0 in | Wt 189.0 lb

## 2020-01-28 DIAGNOSIS — J4551 Severe persistent asthma with (acute) exacerbation: Secondary | ICD-10-CM

## 2020-01-28 MED ORDER — TRIAMCINOLONE ACETONIDE 55 MCG/ACT NA AERO: INHALATION_SPRAY | NASAL | 1 refills | Status: DC

## 2020-01-28 MED ORDER — BUDESONIDE-FORMOTEROL FUMARATE 160-4.5 MCG/ACT IN AERO: INHALATION_SPRAY | RESPIRATORY_TRACT | 1 refills | Status: DC

## 2020-01-28 MED ORDER — OLOPATADINE HCL 0.2 % OP SOLN: OPHTHALMIC | 1 refills | Status: DC

## 2020-01-28 MED ORDER — LEVOCETIRIZINE DIHYDROCHLORIDE 5 MG PO TABS: ORAL_TABLET | ORAL | 1 refills | Status: DC

## 2020-01-28 NOTE — Progress Notes (Signed)
[address] [place] [address] Dept: [phone]  FOLLOW UP NOTE  Patient ID: Katie Gates, female    DOB: 1965/12/07  Age: 54 y.o. MRN: [id number] Date of Office Visit: 01/28/2020  Assessment  Chief Complaint: Allergic Rhinitis   HPI Katie Gates is a 54 year old female who presents to the clinic for follow-up visit.  She was last seen in this clinic on 07/13/2019 by Dr. Dellis Anes for evaluation of asthma, allergic rhinitis, chronic urticaria, and food allergy to peanut, peanut, onion, and red dye. At today's visit, she reports that her asthma has not been well controlled with shortness of breath and wheeze with activity which began about 2 months ago. She reports a cough with thick clear to yellow and back to clear mucus. She continues montelukast 10 mg once a day, Symbicort 160-2 puffs twice a day with a spacer, Spiriva 2.5 mcg once a day, and uses albuterol via inhaler 4 times a day and albuterol via nebulizer 2 times a day. She continues Xolair 300 mg injections once every 2 weeks. Allergic rhinitis is reported as not well controlled with symptoms including nasal congestion, sinus pressure abound both eyes for the last 3 days, post nasal drainage, and sneezing. She reports that she is not taking an antihistamine other than hydroxyzine once a night for itch or using a nasal saline rinse or nasal steroid spray. Allergic conjunctivitis is reported as not well controlled with symptoms including occular pruritus, redness, and clear drainage. She continues to use a lubricating eye drop. Urticaria and pruritis is reported as moderately well controlled with hydroxyzine 25 mg once at bedtime. She continues to avoid beans, peanuts, onion, and red dye with no accidental ingestion or Epipen use since her last visit to this clinic. Her current medications are listed in the chart.   Drug Allergies:  Allergies  Allergen Reactions  . Other Itching and Hives    Avoids corn kernels alone, can  have corn as an ingredient. MM, RD 08/15/16  . Peanut-Containing Drug Products Anaphylaxis  . Red Dye Hives  . Bean Pod Extract Hives    Avoids kidney beans  . Fluticasone Other (See Comments)    NOSE BLEEDS  . Garlic Other (See Comments)    STARTS ASTHMA ATTACKS  . Hydroxyzine Itching    Can take Benadryl and other antihistamines fine  . Keflex [Cephalexin]   . Onion Other (See Comments)    RAW ONION=Headache  . Robitussin (Alcohol Free) [Guaifenesin]   . Tomato Itching    Physical Exam: BP 110/80 (BP Location: Left Arm, Patient Position: Sitting, Cuff Size: Normal)   Pulse 94   Temp 98.1 F (36.7 C) (Oral)   Resp (!) 28   Ht 5\' 7"  (1.702 m)   Wt 189 lb (85.7 kg)   SpO2 99%   BMI 29.60 kg/m    Physical Exam Vitals reviewed.  Constitutional:      Appearance: Normal appearance.  HENT:     Head: Normocephalic and atraumatic.     Right Ear: Tympanic membrane normal.     Left Ear: Tympanic membrane normal.     Nose:     Comments: Bilateral nares edematous and pale with clear nasal drainage noted. Pharynx slightly erythematous. Ears normal. Eyes normal. Eyes:     Conjunctiva/sclera: Conjunctivae normal.  Cardiovascular:     Rate and Rhythm: Normal rate and regular rhythm.     Heart sounds: Normal heart sounds. No murmur heard.   Pulmonary:     Effort:  Pulmonary effort is normal.     Breath sounds: Normal breath sounds.     Comments: Lungs clear to auscultation Musculoskeletal:     Cervical back: Normal range of motion and neck supple.  Neurological:     Mental Status: She is alert.     Diagnostics: FVC 1.79, FEV1 1.63. Predicted FVC 3.10, predicted FEV1 2.46. Spirometry indicates moderate restriction. Post bronchodilator therapy FVC 1.43 FEV1 1.41. Post bronchodilator therapy indicates no significant bronchodilator response.   Assessment and Plan: 1. Severe persistent asthma with acute exacerbation     Meds ordered this encounter  Medications  .  levocetirizine (XYZAL) 5 MG tablet    Sig: Take 1 tablet once a day for runny nose or itchy eyes    Dispense:  90 tablet    Refill:  1    Dispense 90 day supply  . triamcinolone (NASACORT) 55 MCG/ACT AERO nasal inhaler    Sig: 2 sprays per nostril once a day as needed for stuffy nose    Dispense:  3 Inhaler    Refill:  1    Dispense 90 day supply  . Olopatadine HCl (PATADAY) 0.2 % SOLN    Sig: 1 drop each eye once daily as needed for itchy eyes    Dispense:  7.5 mL    Refill:  1    Dispense 90 day supply  . budesonide-formoterol (SYMBICORT) 160-4.5 MCG/ACT inhaler    Sig: 2 puffs twice daily to prevent coughing or wheezing.    Dispense:  30.6 g    Refill:  1    Dispense 90 day supply    Patient Instructions  Asthma Begin prednisone 10 mg twice a day for 3 days, then take 2 tablets once a day for 1 day, then take 1 tablet once a day on the 5th day, then stop Continue Symbicort 160-2 puffs twice a day with a spacer to prevent cough or wheeze Continue montelukast 10 mg once a day to prevent cough or wheeze Continue Spiriva 2.5 mg 1 puff once a day to prevent cough or wheeze  Continue albuterol 2 puffs every 4 hours as needed for cough or wheeze Continue Xolair once every 2 weeks and have access to an epinephrine autoinjector  Allergic rhinitis Begin prednisone as above Continue allergen avoidance  Begin levocetirizine 5 mg once a day as needed for a runny nose Begin Nasocort 2 sprays in each nostril once a day as needed for a stuffy nose.  In the right nostril, point the applicator out toward the right ear. In the left nostril, point the applicator out toward the left ear Consider saline nasal rinses as needed for nasal symptoms. Use this before any medicated nasal sprays for best result  Allergic conjunctivitis Begin Pataday eye drop one drop in each eye once a day as needed for red, itchy eyes Continue your dry eye drop from your eye doctor  Urticaria Continue hydroxyzine 25  mg once a day as needed for itch Continue Xolair once every 2 weeks If your symptoms re-occur, begin a journal of events that occurred for up to 6 hours before your symptoms began including foods and beverages consumed, soaps or perfumes you had contact with, and medications.   Food allergy Continue to avoid beans, peanuts, onion, and red dye. In case of an allergic reaction, take Benadryl 50 mg every 4 hours, and if life-threatening symptoms occur, inject with EpiPen 0.3 mg.  Call the clinic if this treatment plan is not working well for  you  Follow up in 2 weeks or sooner if needed.    Return in about 2 weeks (around 02/11/2020), or if symptoms worsen or fail to improve.    Thank you for the opportunity to care for this patient.  Please do not hesitate to contact me with questions.  Gareth Morgan, FNP [facility]

## 2020-01-28 NOTE — Patient Instructions (Addendum)
Asthma Begin prednisone 10 mg twice a day for 3 days, then take 2 tablets once a day for 1 day, then take 1 tablet once a day on the 5th day, then stop Continue Symbicort 160-2 puffs twice a day with a spacer to prevent cough or wheeze Continue montelukast 10 mg once a day to prevent cough or wheeze Continue Spiriva 2.5 mg 1 puff once a day to prevent cough or wheeze  Continue albuterol 2 puffs every 4 hours as needed for cough or wheeze Continue Xolair once every 2 weeks and have access to an epinephrine autoinjector  Allergic rhinitis Begin prednisone as above Continue allergen avoidance  Begin levocetirizine 5 mg once a day as needed for a runny nose Begin Nasocort 2 sprays in each nostril once a day as needed for a stuffy nose.  In the right nostril, point the applicator out toward the right ear. In the left nostril, point the applicator out toward the left ear Consider saline nasal rinses as needed for nasal symptoms. Use this before any medicated nasal sprays for best result  Allergic conjunctivitis Begin Pataday eye drop one drop in each eye once a day as needed for red, itchy eyes Continue your dry eye drop from your eye doctor  Urticaria Continue hydroxyzine 25 mg once a day as needed for itch Continue Xolair once every 2 weeks If your symptoms re-occur, begin a journal of events that occurred for up to 6 hours before your symptoms began including foods and beverages consumed, soaps or perfumes you had contact with, and medications.   Food allergy Continue to avoid beans, peanuts, onion, and red dye. In case of an allergic reaction, take Benadryl 50 mg every 4 hours, and if life-threatening symptoms occur, inject with EpiPen 0.3 mg.  Call the clinic if this treatment plan is not working well for you  Follow up in 2 weeks or sooner if needed.

## 2020-02-04 ENCOUNTER — Ambulatory Visit: Payer: Self-pay

## 2020-02-24 ENCOUNTER — Other Ambulatory Visit: Payer: Self-pay

## 2020-02-24 ENCOUNTER — Ambulatory Visit (INDEPENDENT_AMBULATORY_CARE_PROVIDER_SITE_OTHER): Payer: Medicare (Managed Care)

## 2020-02-24 DIAGNOSIS — L501 Idiopathic urticaria: Secondary | ICD-10-CM

## 2020-03-09 ENCOUNTER — Ambulatory Visit (INDEPENDENT_AMBULATORY_CARE_PROVIDER_SITE_OTHER): Payer: Medicare (Managed Care)

## 2020-03-09 ENCOUNTER — Other Ambulatory Visit: Payer: Self-pay

## 2020-03-09 DIAGNOSIS — L501 Idiopathic urticaria: Secondary | ICD-10-CM | POA: Diagnosis not present

## 2020-03-23 ENCOUNTER — Other Ambulatory Visit: Payer: Self-pay

## 2020-03-23 ENCOUNTER — Ambulatory Visit (INDEPENDENT_AMBULATORY_CARE_PROVIDER_SITE_OTHER): Payer: Medicare (Managed Care) | Admitting: *Deleted

## 2020-03-23 DIAGNOSIS — J4540 Moderate persistent asthma, uncomplicated: Secondary | ICD-10-CM | POA: Diagnosis not present

## 2020-03-25 ENCOUNTER — Other Ambulatory Visit: Payer: Self-pay | Admitting: Allergy & Immunology

## 2020-04-05 ENCOUNTER — Other Ambulatory Visit: Payer: Self-pay | Admitting: *Deleted

## 2020-04-05 MED ORDER — ALBUTEROL SULFATE HFA 108 (90 BASE) MCG/ACT IN AERS: INHALATION_SPRAY | RESPIRATORY_TRACT | 0 refills | Status: DC

## 2020-04-06 ENCOUNTER — Ambulatory Visit (INDEPENDENT_AMBULATORY_CARE_PROVIDER_SITE_OTHER): Payer: Medicare (Managed Care)

## 2020-04-06 ENCOUNTER — Other Ambulatory Visit: Payer: Self-pay

## 2020-04-06 DIAGNOSIS — J4540 Moderate persistent asthma, uncomplicated: Secondary | ICD-10-CM

## 2020-04-06 DIAGNOSIS — L501 Idiopathic urticaria: Secondary | ICD-10-CM

## 2020-04-20 ENCOUNTER — Ambulatory Visit (INDEPENDENT_AMBULATORY_CARE_PROVIDER_SITE_OTHER): Payer: Medicare Other | Admitting: *Deleted

## 2020-04-20 DIAGNOSIS — L501 Idiopathic urticaria: Secondary | ICD-10-CM | POA: Diagnosis not present

## 2020-04-20 DIAGNOSIS — J4540 Moderate persistent asthma, uncomplicated: Secondary | ICD-10-CM

## 2020-04-26 ENCOUNTER — Other Ambulatory Visit: Payer: Self-pay | Admitting: Allergy & Immunology

## 2020-05-04 ENCOUNTER — Other Ambulatory Visit: Payer: Self-pay

## 2020-05-04 ENCOUNTER — Ambulatory Visit (INDEPENDENT_AMBULATORY_CARE_PROVIDER_SITE_OTHER): Payer: Medicare (Managed Care)

## 2020-05-04 DIAGNOSIS — J4540 Moderate persistent asthma, uncomplicated: Secondary | ICD-10-CM

## 2020-05-12 ENCOUNTER — Encounter: Payer: Self-pay | Admitting: Family Medicine

## 2020-05-18 ENCOUNTER — Ambulatory Visit (INDEPENDENT_AMBULATORY_CARE_PROVIDER_SITE_OTHER): Payer: Medicare (Managed Care)

## 2020-05-18 ENCOUNTER — Other Ambulatory Visit: Payer: Self-pay

## 2020-05-18 DIAGNOSIS — J4540 Moderate persistent asthma, uncomplicated: Secondary | ICD-10-CM | POA: Diagnosis not present

## 2020-05-27 ENCOUNTER — Other Ambulatory Visit: Payer: Self-pay | Admitting: Allergy & Immunology

## 2020-05-27 ENCOUNTER — Other Ambulatory Visit: Payer: Self-pay | Admitting: Family Medicine

## 2020-05-31 ENCOUNTER — Other Ambulatory Visit: Payer: Self-pay | Admitting: Family Medicine

## 2020-06-01 ENCOUNTER — Ambulatory Visit (INDEPENDENT_AMBULATORY_CARE_PROVIDER_SITE_OTHER): Payer: Medicare (Managed Care)

## 2020-06-01 ENCOUNTER — Other Ambulatory Visit: Payer: Self-pay

## 2020-06-01 DIAGNOSIS — J4540 Moderate persistent asthma, uncomplicated: Secondary | ICD-10-CM

## 2020-06-15 ENCOUNTER — Ambulatory Visit (INDEPENDENT_AMBULATORY_CARE_PROVIDER_SITE_OTHER): Payer: Medicare (Managed Care)

## 2020-06-15 ENCOUNTER — Other Ambulatory Visit: Payer: Self-pay

## 2020-06-15 DIAGNOSIS — L501 Idiopathic urticaria: Secondary | ICD-10-CM | POA: Diagnosis not present

## 2020-06-17 ENCOUNTER — Telehealth: Payer: Self-pay | Admitting: Allergy & Immunology

## 2020-06-17 MED ORDER — MUPIROCIN 2 % EX OINT: TOPICAL_OINTMENT | Freq: Two times a day (BID) | CUTANEOUS | 3 refills | Status: AC

## 2020-06-17 NOTE — Telephone Encounter (Signed)
She is aware. I am going to get the medication to [facility] in the next week so she can pick it up when she gets Xolair on November 24th.   Malachi Bonds, MD [facility]

## 2020-06-17 NOTE — Telephone Encounter (Signed)
Ok. Thanks!

## 2020-06-17 NOTE — Telephone Encounter (Signed)
I called the patient to offer her some use doses of Humira.  She is very excited about excepting those.  I will get those to [facility] and they can stay in the fridge there until she gets her next Xolair on November 24.  She also requested a refill of Bactroban for sores in her nose.  I confirmed the pharmacy and sent that in.  Malachi Bonds, MD [facility]

## 2020-06-17 NOTE — Telephone Encounter (Signed)
Do she need to be shown how to use the pen or does she already know?

## 2020-06-23 ENCOUNTER — Other Ambulatory Visit: Payer: Self-pay | Admitting: Allergy & Immunology

## 2020-06-29 ENCOUNTER — Ambulatory Visit (INDEPENDENT_AMBULATORY_CARE_PROVIDER_SITE_OTHER): Payer: Medicare (Managed Care)

## 2020-06-29 DIAGNOSIS — J4540 Moderate persistent asthma, uncomplicated: Secondary | ICD-10-CM

## 2020-07-05 ENCOUNTER — Other Ambulatory Visit: Payer: Self-pay | Admitting: Pediatrics

## 2020-07-12 NOTE — Patient Instructions (Addendum)
Asthma  Prednisone 10 mg tablets. Take 2 tablets once a day for 4 days, then take 1 tablet on the 5th day, then stop. If no improvement in symptoms, move forward with chest xray Increase Symbicort 160- to 2 puffs twice a day to prevent cough or wheeze Continue Spiriva 2.5 mg 1 puff once a day to prevent cough or wheeze  Continue montelukast 10 mg once a day to prevent cough or wheeze Continue albuterol 2 puffs every 4 hours as needed for cough or wheeze Continue Xolair once every 2 weeks and have access to an epinephrine autoinjector  Rhinitis Begin prednisone as above Begin levocetirizine 5 mg once a day as needed for a runny nose Begin Nasocort 2 sprays in each nostril once a day as needed for a stuffy nose.  In the right nostril, point the applicator out toward the right ear. In the left nostril, point the applicator out toward the left ear Continue saline nasal rinses as needed for nasal symptoms. Use this before any medicated nasal sprays for best result For dry nostrils, begin nasal saline gel as needed  Allergic conjunctivitis Begin Pataday eye drop one drop in each eye once a day as needed for red, itchy eyes Continue your dry eye drop from your eye doctor  Urticaria Continue hydroxyzine 25 mg once a day as needed for itch Continue Xolair once every 2 weeks If your symptoms re-occur, begin a journal of events that occurred for up to 6 hours before your symptoms began including foods and beverages consumed, soaps or perfumes you had contact with, and medications.   Food allergy Continue to avoid beans, peanuts, onion, and red dye. In case of an allergic reaction, take Benadryl 50 mg every 4 hours, and if life-threatening symptoms occur, inject with EpiPen 0.3 mg.  Call the clinic if this treatment plan is not working well for you  Follow up in 2 months or sooner if needed.

## 2020-07-12 NOTE — Progress Notes (Addendum)
[address] [place] [address] Dept: [phone]  FOLLOW UP NOTE  Patient ID: Katie Gates, female    DOB: [birth year]  Age: 54 y.o. MRN: [id number] Date of Office Visit: 07/13/2020  Assessment  Chief Complaint: Asthma and Urticaria  HPI Katie Gates is a 54 year old female who presents the clinic for follow-up visit.  She was last seen in this clinic on 01/28/2020 for evaluation of asthma, allergic rhinitis, allergic conjunctivitis, chronic urticaria, and food allergy to peanut, onion, red dye, beans, and corn.  At today's visit she reports her asthma has been moderately well controlled with occasional shortness of breath with activity or when she goes outside, occasional wheezing when outside, and she reports her cough which is producing thick yellow mucus for the last 2 weeks.  She continues montelukast 10 mg once a day, Spiriva 2.5 mcg 1 puff every morning.  Symbicort 160-1 puff in the morning and 1 puff in the evening, and using albuterol 4-5 times a day over the last 2 weeks.  Allergic rhinitis is reported as moderately well controlled with clear rhinorrhea, sneezing, and increased postnasal drainage for which she continues levocetirizine 5 mg once a day, Nasonex daily, and occasional saline nasal spray.  She does report a slight headache under both eyes, however, this is worse on the left side.  No visual changes or blurry vision noted.  Allergic conjunctivitis is reported as poorly controlled with red, itchy, runny eyes for which she is not currently using any medical intervention.  Chronic urticaria is reported as well controlled with no hives breakouts since her last visit to this clinic.  She continues Xolair once every 2 weeks and reports this has been going well.  She continues hydroxyzine 25 mg once or twice a day.  She continues to avoid peanut, corn, onion, red eye, and beans with no accidental ingestion or EpiPen use since her last visit to this clinic.  Reflux is  reported as well controlled with famotidine and omeprazole daily.  Atopic dermatitis is reported as well controlled with an occasional rash which occurs especially after exposure to grass or harsh chemical detergents.  She denies epistaxis since her last visit to this clinic.  Her current medications are listed in the chart.   Drug Allergies:  Allergies  Allergen Reactions  . Other Itching and Hives    Avoids corn kernels alone, can have corn as an ingredient. MM, RD 08/15/16  . Peanut-Containing Drug Products Anaphylaxis  . Red Dye Hives  . Bean Pod Extract Hives    Avoids kidney beans  . Fluticasone Other (See Comments)    NOSE BLEEDS  . Garlic Other (See Comments)    STARTS ASTHMA ATTACKS  . Hydroxyzine Itching    Can take Benadryl and other antihistamines fine  . Keflex [Cephalexin]   . Onion Other (See Comments)    RAW ONION=Headache  . Robitussin (Alcohol Free) [Guaifenesin]   . Tomato Itching    Physical Exam: BP 100/74   Pulse 90   Temp (!) 97.3 F (36.3 C) (Tympanic)   Resp (!) 24   SpO2 98%    Physical Exam Vitals reviewed.  Constitutional:      Appearance: Normal appearance.  HENT:     Head: Normocephalic and atraumatic.     Right Ear: Tympanic membrane normal.     Left Ear: Tympanic membrane normal.     Nose:     Comments: Bilateral nares edematous and pale with clear nasal drainage noted.  Pharynx  normal.  Ears normal.  Eyes normal.    Mouth/Throat:     Pharynx: Oropharynx is clear.  Eyes:     Conjunctiva/sclera: Conjunctivae normal.  Cardiovascular:     Rate and Rhythm: Normal rate and regular rhythm.     Heart sounds: Normal heart sounds. No murmur heard.   Pulmonary:     Effort: Pulmonary effort is normal.     Breath sounds: Normal breath sounds.     Comments: Lungs clear to auscultation. Musculoskeletal:        General: Normal range of motion.     Cervical back: Normal range of motion and neck supple.  Skin:    General: Skin is warm and dry.   Neurological:     Mental Status: She is alert and oriented to person, place, and time.  Psychiatric:        Mood and Affect: Mood normal.        Behavior: Behavior normal.        Thought Content: Thought content normal.        Judgment: Judgment normal.     Diagnostics: FVC 2.23, FEV1 1.97.  Predicted FVC 3.10, predicted FEV1 2.46.  Spirometry indicates mild restriction.  This is consistent with previous spirometry readings.  Assessment and Plan: 1. Moderate persistent asthma, unspecified whether complicated   2. Non-allergic rhinitis   3. Seasonal allergic conjunctivitis   4. Chronic urticaria   5. Multiple food allergies   6. Intrinsic atopic dermatitis   7. Gastroesophageal reflux disease, unspecified whether esophagitis present   8. Tracheostomy in place [facility])     Meds ordered this encounter  Medications  . Olopatadine HCl (PATADAY) 0.2 % SOLN    Sig: 1 drop each eye once daily as needed for itchy eyes    Dispense:  7.5 mL    Refill:  1    Dispense 90 day supply    Patient Instructions  Asthma  Prednisone 10 mg tablets. Take 2 tablets once a day for 4 days, then take 1 tablet on the 5th day, then stop. If no improvement in symptoms, move forward with chest xray Increase Symbicort 160- to 2 puffs twice a day to prevent cough or wheeze Continue Spiriva 2.5 mg 1 puff once a day to prevent cough or wheeze  Continue montelukast 10 mg once a day to prevent cough or wheeze Continue albuterol 2 puffs every 4 hours as needed for cough or wheeze Continue Xolair once every 2 weeks and have access to an epinephrine autoinjector  Rhinitis Begin prednisone as above Begin levocetirizine 5 mg once a day as needed for a runny nose Begin Nasocort 2 sprays in each nostril once a day as needed for a stuffy nose.  In the right nostril, point the applicator out toward the right ear. In the left nostril, point the applicator out toward the left ear Continue saline nasal rinses as needed  for nasal symptoms. Use this before any medicated nasal sprays for best result For dry nostrils, begin nasal saline gel as needed  Allergic conjunctivitis Begin Pataday eye drop one drop in each eye once a day as needed for red, itchy eyes Continue your dry eye drop from your eye doctor  Urticaria Continue hydroxyzine 25 mg once a day as needed for itch Continue Xolair once every 2 weeks If your symptoms re-occur, begin a journal of events that occurred for up to 6 hours before your symptoms began including foods and beverages consumed, soaps or perfumes you had  contact with, and medications.   Food allergy Continue to avoid beans, peanuts, onion, and red dye. In case of an allergic reaction, take Benadryl 50 mg every 4 hours, and if life-threatening symptoms occur, inject with EpiPen 0.3 mg.  Call the clinic if this treatment plan is not working well for you  Follow up in 2 months or sooner if needed.    Return in about 2 months (around 09/13/2020), or if symptoms worsen or fail to improve.    Thank you for the opportunity to care for this patient.  Please do not hesitate to contact me with questions.  Thermon Leyland, FNP [facility]  ________________________________________________  I have provided oversight concerning [facility]'s evaluation and treatment of this patient's health issues addressed during today's encounter.  I agree with the assessment and therapeutic plan as outlined in the note.   Signed,   R Jorene Guest, MD

## 2020-07-13 ENCOUNTER — Ambulatory Visit (INDEPENDENT_AMBULATORY_CARE_PROVIDER_SITE_OTHER): Payer: Medicare (Managed Care) | Admitting: Family Medicine

## 2020-07-13 ENCOUNTER — Encounter: Payer: Self-pay | Admitting: Family Medicine

## 2020-07-13 ENCOUNTER — Other Ambulatory Visit: Payer: Self-pay

## 2020-07-13 ENCOUNTER — Ambulatory Visit: Payer: Medicare (Managed Care)

## 2020-07-13 ENCOUNTER — Other Ambulatory Visit: Payer: Self-pay | Admitting: Pediatrics

## 2020-07-13 VITALS — BP 100/74 | HR 90 | Temp 97.3°F | Resp 24

## 2020-07-13 DIAGNOSIS — J4540 Moderate persistent asthma, uncomplicated: Secondary | ICD-10-CM

## 2020-07-13 DIAGNOSIS — Z930 Tracheostomy status: Secondary | ICD-10-CM | POA: Insufficient documentation

## 2020-07-13 DIAGNOSIS — H1010 Acute atopic conjunctivitis, unspecified eye: Secondary | ICD-10-CM

## 2020-07-13 DIAGNOSIS — J310 Chronic rhinitis: Secondary | ICD-10-CM | POA: Diagnosis not present

## 2020-07-13 DIAGNOSIS — T781XXD Other adverse food reactions, not elsewhere classified, subsequent encounter: Secondary | ICD-10-CM

## 2020-07-13 DIAGNOSIS — K219 Gastro-esophageal reflux disease without esophagitis: Secondary | ICD-10-CM

## 2020-07-13 DIAGNOSIS — L508 Other urticaria: Secondary | ICD-10-CM

## 2020-07-13 DIAGNOSIS — L2084 Intrinsic (allergic) eczema: Secondary | ICD-10-CM

## 2020-07-13 MED ORDER — OLOPATADINE HCL 0.2 % OP SOLN: OPHTHALMIC | 1 refills | Status: AC

## 2020-07-15 ENCOUNTER — Other Ambulatory Visit: Payer: Self-pay | Admitting: Pediatrics

## 2020-07-18 MED ORDER — OMALIZUMAB 150 MG ~~LOC~~ SOLR: 300.0000 mg | SUBCUTANEOUS | 11 refills | Status: DC

## 2020-07-18 MED ORDER — XOLAIR 150 MG ~~LOC~~ SOLR
300.0000 mg | SUBCUTANEOUS | 11 refills | Status: DC
Start: 2020-07-18 — End: 2020-07-18

## 2020-07-18 NOTE — Addendum Note (Signed)
Addended by: Devoria Glassing on: 07/18/2020 08:52 AM   Modules accepted: Orders

## 2020-07-18 NOTE — Addendum Note (Signed)
Addended by: Devoria Glassing on: 07/18/2020 08:59 AM   Modules accepted: Orders

## 2020-07-20 ENCOUNTER — Other Ambulatory Visit: Payer: Self-pay | Admitting: Family Medicine

## 2020-07-20 ENCOUNTER — Other Ambulatory Visit: Payer: Self-pay | Admitting: Allergy & Immunology

## 2020-07-20 NOTE — Telephone Encounter (Signed)
I went back 3 office visits and did not see pt on incruse please advise to refill if it is appropriate?

## 2020-07-27 ENCOUNTER — Ambulatory Visit (INDEPENDENT_AMBULATORY_CARE_PROVIDER_SITE_OTHER): Payer: Medicare (Managed Care)

## 2020-07-27 ENCOUNTER — Other Ambulatory Visit: Payer: Self-pay

## 2020-07-27 DIAGNOSIS — L501 Idiopathic urticaria: Secondary | ICD-10-CM | POA: Diagnosis not present

## 2020-08-10 ENCOUNTER — Other Ambulatory Visit: Payer: Self-pay

## 2020-08-10 ENCOUNTER — Ambulatory Visit (INDEPENDENT_AMBULATORY_CARE_PROVIDER_SITE_OTHER): Payer: Medicare (Managed Care) | Admitting: *Deleted

## 2020-08-10 DIAGNOSIS — J4540 Moderate persistent asthma, uncomplicated: Secondary | ICD-10-CM

## 2020-08-23 ENCOUNTER — Other Ambulatory Visit: Payer: Self-pay | Admitting: Family Medicine

## 2020-08-23 ENCOUNTER — Other Ambulatory Visit: Payer: Self-pay | Admitting: Allergy & Immunology

## 2020-08-24 ENCOUNTER — Ambulatory Visit (INDEPENDENT_AMBULATORY_CARE_PROVIDER_SITE_OTHER): Payer: Medicare (Managed Care)

## 2020-08-24 ENCOUNTER — Other Ambulatory Visit: Payer: Self-pay

## 2020-08-24 DIAGNOSIS — L501 Idiopathic urticaria: Secondary | ICD-10-CM | POA: Diagnosis not present

## 2020-08-24 DIAGNOSIS — J4540 Moderate persistent asthma, uncomplicated: Secondary | ICD-10-CM

## 2020-09-07 ENCOUNTER — Ambulatory Visit: Payer: Self-pay

## 2020-09-08 ENCOUNTER — Ambulatory Visit (INDEPENDENT_AMBULATORY_CARE_PROVIDER_SITE_OTHER): Payer: Medicare (Managed Care)

## 2020-09-08 ENCOUNTER — Other Ambulatory Visit: Payer: Self-pay

## 2020-09-08 DIAGNOSIS — L501 Idiopathic urticaria: Secondary | ICD-10-CM | POA: Diagnosis not present

## 2020-09-19 ENCOUNTER — Other Ambulatory Visit: Payer: Self-pay | Admitting: Family Medicine

## 2020-09-22 ENCOUNTER — Ambulatory Visit (INDEPENDENT_AMBULATORY_CARE_PROVIDER_SITE_OTHER): Payer: Medicare (Managed Care) | Admitting: *Deleted

## 2020-09-22 DIAGNOSIS — J4540 Moderate persistent asthma, uncomplicated: Secondary | ICD-10-CM

## 2020-10-03 ENCOUNTER — Ambulatory Visit: Payer: Self-pay

## 2020-10-03 ENCOUNTER — Other Ambulatory Visit: Payer: Self-pay

## 2020-10-03 MED ORDER — SYMBICORT 160-4.5 MCG/ACT IN AERO
INHALATION_SPRAY | RESPIRATORY_TRACT | 0 refills | Status: DC
Start: 2020-10-03 — End: 2020-10-03

## 2020-10-03 MED ORDER — SYMBICORT 160-4.5 MCG/ACT IN AERO: INHALATION_SPRAY | RESPIRATORY_TRACT | 0 refills | Status: DC

## 2020-10-03 NOTE — Telephone Encounter (Addendum)
Refill for Symbicort 160 mcg given x 3 (90 day supply) with no refills at [facility].

## 2020-10-03 NOTE — Addendum Note (Signed)
Addended by: Janie Morning on: 10/03/2020 04:15 PM   Modules accepted: Orders

## 2020-10-03 NOTE — Addendum Note (Signed)
Addended by: Janie Morning on: 10/03/2020 04:20 PM   Modules accepted: Orders

## 2020-10-06 ENCOUNTER — Ambulatory Visit (INDEPENDENT_AMBULATORY_CARE_PROVIDER_SITE_OTHER): Payer: Medicare (Managed Care)

## 2020-10-06 ENCOUNTER — Other Ambulatory Visit: Payer: Self-pay

## 2020-10-06 DIAGNOSIS — J4540 Moderate persistent asthma, uncomplicated: Secondary | ICD-10-CM

## 2020-10-07 ENCOUNTER — Other Ambulatory Visit: Payer: Self-pay

## 2020-10-07 MED ORDER — BUDESONIDE-FORMOTEROL FUMARATE 160-4.5 MCG/ACT IN AERO: 2.0000 | INHALATION_SPRAY | Freq: Two times a day (BID) | RESPIRATORY_TRACT | 0 refills | Status: DC

## 2020-10-20 ENCOUNTER — Ambulatory Visit (INDEPENDENT_AMBULATORY_CARE_PROVIDER_SITE_OTHER): Payer: Medicare (Managed Care)

## 2020-10-20 ENCOUNTER — Other Ambulatory Visit: Payer: Self-pay

## 2020-10-20 DIAGNOSIS — L500 Allergic urticaria: Secondary | ICD-10-CM

## 2020-10-26 ENCOUNTER — Other Ambulatory Visit: Payer: Self-pay | Admitting: Family Medicine

## 2020-11-03 ENCOUNTER — Ambulatory Visit (INDEPENDENT_AMBULATORY_CARE_PROVIDER_SITE_OTHER): Payer: Medicare (Managed Care)

## 2020-11-03 ENCOUNTER — Other Ambulatory Visit: Payer: Self-pay

## 2020-11-03 DIAGNOSIS — L501 Idiopathic urticaria: Secondary | ICD-10-CM | POA: Diagnosis not present

## 2020-11-17 ENCOUNTER — Other Ambulatory Visit: Payer: Self-pay

## 2020-11-17 ENCOUNTER — Ambulatory Visit (INDEPENDENT_AMBULATORY_CARE_PROVIDER_SITE_OTHER): Payer: Medicare (Managed Care)

## 2020-11-17 DIAGNOSIS — L501 Idiopathic urticaria: Secondary | ICD-10-CM | POA: Diagnosis not present

## 2020-11-25 ENCOUNTER — Other Ambulatory Visit: Payer: Self-pay | Admitting: Family Medicine

## 2020-12-01 ENCOUNTER — Other Ambulatory Visit: Payer: Self-pay

## 2020-12-01 ENCOUNTER — Ambulatory Visit (INDEPENDENT_AMBULATORY_CARE_PROVIDER_SITE_OTHER): Payer: Medicare (Managed Care)

## 2020-12-01 DIAGNOSIS — L501 Idiopathic urticaria: Secondary | ICD-10-CM | POA: Diagnosis not present

## 2020-12-15 ENCOUNTER — Other Ambulatory Visit: Payer: Self-pay

## 2020-12-15 ENCOUNTER — Ambulatory Visit (INDEPENDENT_AMBULATORY_CARE_PROVIDER_SITE_OTHER): Payer: Medicare (Managed Care)

## 2020-12-15 DIAGNOSIS — J4540 Moderate persistent asthma, uncomplicated: Secondary | ICD-10-CM

## 2020-12-22 ENCOUNTER — Other Ambulatory Visit: Payer: Self-pay | Admitting: Family Medicine

## 2020-12-29 ENCOUNTER — Ambulatory Visit (INDEPENDENT_AMBULATORY_CARE_PROVIDER_SITE_OTHER): Payer: Medicare (Managed Care)

## 2020-12-29 ENCOUNTER — Other Ambulatory Visit: Payer: Self-pay

## 2020-12-29 DIAGNOSIS — L501 Idiopathic urticaria: Secondary | ICD-10-CM

## 2021-01-12 ENCOUNTER — Ambulatory Visit (INDEPENDENT_AMBULATORY_CARE_PROVIDER_SITE_OTHER): Payer: Medicare (Managed Care)

## 2021-01-12 ENCOUNTER — Other Ambulatory Visit: Payer: Self-pay

## 2021-01-12 DIAGNOSIS — L501 Idiopathic urticaria: Secondary | ICD-10-CM

## 2021-01-18 ENCOUNTER — Other Ambulatory Visit: Payer: Self-pay | Admitting: Family Medicine

## 2021-01-26 ENCOUNTER — Other Ambulatory Visit: Payer: Self-pay

## 2021-01-26 ENCOUNTER — Ambulatory Visit (INDEPENDENT_AMBULATORY_CARE_PROVIDER_SITE_OTHER): Payer: Medicare (Managed Care)

## 2021-01-26 DIAGNOSIS — L501 Idiopathic urticaria: Secondary | ICD-10-CM

## 2021-02-09 ENCOUNTER — Ambulatory Visit (INDEPENDENT_AMBULATORY_CARE_PROVIDER_SITE_OTHER): Payer: Medicare (Managed Care)

## 2021-02-09 ENCOUNTER — Other Ambulatory Visit: Payer: Self-pay

## 2021-02-09 DIAGNOSIS — L501 Idiopathic urticaria: Secondary | ICD-10-CM | POA: Diagnosis not present

## 2021-02-16 ENCOUNTER — Other Ambulatory Visit: Payer: Self-pay | Admitting: Family Medicine

## 2021-02-17 ENCOUNTER — Other Ambulatory Visit: Payer: Self-pay | Admitting: Family Medicine

## 2021-02-23 ENCOUNTER — Other Ambulatory Visit: Payer: Self-pay

## 2021-02-23 ENCOUNTER — Ambulatory Visit (INDEPENDENT_AMBULATORY_CARE_PROVIDER_SITE_OTHER): Payer: Medicare (Managed Care)

## 2021-02-23 DIAGNOSIS — L501 Idiopathic urticaria: Secondary | ICD-10-CM | POA: Diagnosis not present

## 2021-03-09 ENCOUNTER — Ambulatory Visit (INDEPENDENT_AMBULATORY_CARE_PROVIDER_SITE_OTHER): Payer: Medicare Other

## 2021-03-09 ENCOUNTER — Other Ambulatory Visit: Payer: Self-pay

## 2021-03-09 DIAGNOSIS — L501 Idiopathic urticaria: Secondary | ICD-10-CM

## 2021-03-23 ENCOUNTER — Ambulatory Visit (INDEPENDENT_AMBULATORY_CARE_PROVIDER_SITE_OTHER): Payer: Medicare Other

## 2021-03-23 ENCOUNTER — Other Ambulatory Visit: Payer: Self-pay

## 2021-03-23 DIAGNOSIS — L501 Idiopathic urticaria: Secondary | ICD-10-CM | POA: Diagnosis not present

## 2021-03-23 MED ORDER — INCRUSE ELLIPTA 62.5 MCG/INH IN AEPB: INHALATION_SPRAY | RESPIRATORY_TRACT | 0 refills | Status: DC

## 2021-03-23 MED ORDER — LEVOCETIRIZINE DIHYDROCHLORIDE 5 MG PO TABS: ORAL_TABLET | ORAL | 0 refills | Status: DC

## 2021-03-23 MED ORDER — BUDESONIDE-FORMOTEROL FUMARATE 160-4.5 MCG/ACT IN AERO: 2.0000 | INHALATION_SPRAY | Freq: Two times a day (BID) | RESPIRATORY_TRACT | 0 refills | Status: DC

## 2021-04-03 ENCOUNTER — Other Ambulatory Visit: Payer: Self-pay | Admitting: *Deleted

## 2021-04-03 ENCOUNTER — Other Ambulatory Visit: Payer: Self-pay

## 2021-04-06 ENCOUNTER — Other Ambulatory Visit: Payer: Self-pay

## 2021-04-06 ENCOUNTER — Ambulatory Visit (INDEPENDENT_AMBULATORY_CARE_PROVIDER_SITE_OTHER): Payer: Medicare Other

## 2021-04-06 DIAGNOSIS — L501 Idiopathic urticaria: Secondary | ICD-10-CM

## 2021-04-11 ENCOUNTER — Telehealth: Payer: Self-pay

## 2021-04-11 MED ORDER — BUDESONIDE-FORMOTEROL FUMARATE 160-4.5 MCG/ACT IN AERO: 2.0000 | INHALATION_SPRAY | Freq: Two times a day (BID) | RESPIRATORY_TRACT | 0 refills | Status: DC

## 2021-04-11 MED ORDER — INCRUSE ELLIPTA 62.5 MCG/INH IN AEPB: INHALATION_SPRAY | RESPIRATORY_TRACT | 0 refills | Status: DC

## 2021-04-11 MED ORDER — LEVOCETIRIZINE DIHYDROCHLORIDE 5 MG PO TABS
ORAL_TABLET | ORAL | 0 refills | Status: DC
Start: 2021-04-11 — End: 2021-04-19

## 2021-04-11 NOTE — Telephone Encounter (Signed)
[facility] refills sent in wrong pharmacy was in the chart so fixed the pharmacy and sent rx;s to the correct one

## 2021-04-11 NOTE — Telephone Encounter (Signed)
Patients new pharmacy [facility] called stating they sent a request on 03/31/2021 to send the patients medications to their pharmacy. The phone number and fax number in epic is different than what the representative gave me.  P: [phone] F: [phone]   Incruse, Xyzal & Symbicort  Can someone check with the patient to make sure this is the right pharmacy also?

## 2021-04-15 ENCOUNTER — Other Ambulatory Visit: Payer: Self-pay | Admitting: Family Medicine

## 2021-04-19 ENCOUNTER — Ambulatory Visit (INDEPENDENT_AMBULATORY_CARE_PROVIDER_SITE_OTHER): Payer: Medicare Other

## 2021-04-19 ENCOUNTER — Ambulatory Visit (INDEPENDENT_AMBULATORY_CARE_PROVIDER_SITE_OTHER): Payer: Medicare Other | Admitting: Family Medicine

## 2021-04-19 ENCOUNTER — Ambulatory Visit (HOSPITAL_BASED_OUTPATIENT_CLINIC_OR_DEPARTMENT_OTHER)
Admission: RE | Admit: 2021-04-19 | Discharge: 2021-04-19 | Disposition: A | Discharge status: Home / Self Care | Payer: Medicare Other | Source: Ambulatory Visit | Attending: Family Medicine | Admitting: Family Medicine

## 2021-04-19 ENCOUNTER — Other Ambulatory Visit: Payer: Self-pay

## 2021-04-19 ENCOUNTER — Encounter: Payer: Self-pay | Admitting: Family Medicine

## 2021-04-19 VITALS — BP 110/70 | HR 100 | Temp 97.7°F | Resp 20 | Ht 67.32 in | Wt 182.6 lb

## 2021-04-19 DIAGNOSIS — R059 Cough, unspecified: Secondary | ICD-10-CM | POA: Insufficient documentation

## 2021-04-19 DIAGNOSIS — L508 Other urticaria: Secondary | ICD-10-CM | POA: Diagnosis not present

## 2021-04-19 DIAGNOSIS — K219 Gastro-esophageal reflux disease without esophagitis: Secondary | ICD-10-CM | POA: Diagnosis not present

## 2021-04-19 DIAGNOSIS — T781XXD Other adverse food reactions, not elsewhere classified, subsequent encounter: Secondary | ICD-10-CM

## 2021-04-19 DIAGNOSIS — L501 Idiopathic urticaria: Secondary | ICD-10-CM

## 2021-04-19 DIAGNOSIS — H1010 Acute atopic conjunctivitis, unspecified eye: Secondary | ICD-10-CM

## 2021-04-19 DIAGNOSIS — J4541 Moderate persistent asthma with (acute) exacerbation: Secondary | ICD-10-CM

## 2021-04-19 DIAGNOSIS — H1013 Acute atopic conjunctivitis, bilateral: Secondary | ICD-10-CM

## 2021-04-19 DIAGNOSIS — Z930 Tracheostomy status: Secondary | ICD-10-CM

## 2021-04-19 MED ORDER — LEVOCETIRIZINE DIHYDROCHLORIDE 5 MG PO TABS: ORAL_TABLET | ORAL | 5 refills | Status: DC

## 2021-04-19 MED ORDER — PREDNISONE 10 MG PO TABS: ORAL_TABLET | ORAL | 0 refills | Status: AC

## 2021-04-19 MED ORDER — OLOPATADINE HCL 0.2 % OP SOLN: OPHTHALMIC | 5 refills | Status: AC

## 2021-04-19 MED ORDER — ALBUTEROL SULFATE HFA 108 (90 BASE) MCG/ACT IN AERS: 2.0000 | INHALATION_SPRAY | RESPIRATORY_TRACT | 1 refills | Status: DC | PRN

## 2021-04-19 MED ORDER — BUDESONIDE-FORMOTEROL FUMARATE 160-4.5 MCG/ACT IN AERO: 2.0000 | INHALATION_SPRAY | Freq: Two times a day (BID) | RESPIRATORY_TRACT | 5 refills | Status: DC

## 2021-04-19 MED ORDER — TRIAMCINOLONE ACETONIDE 55 MCG/ACT NA AERO
INHALATION_SPRAY | NASAL | 5 refills | Status: DC
Start: 2021-04-19 — End: 2023-02-19

## 2021-04-19 MED ORDER — INCRUSE ELLIPTA 62.5 MCG/INH IN AEPB: INHALATION_SPRAY | RESPIRATORY_TRACT | 5 refills | Status: DC

## 2021-04-19 NOTE — Addendum Note (Signed)
Addended by: Vincent Peyer A on: 04/19/2021 06:02 PM   Modules accepted: Orders

## 2021-04-19 NOTE — Patient Instructions (Addendum)
Asthma  Get a chest xray. We will call you with the result when it becomes available Continue Symbicort 160- to 2 puffs twice a day to prevent cough or wheeze Continue montelukast 10 mg once a day to prevent cough or wheeze Continue Incruse Ellipta one puff once a day to prevent cough and wheeze Continue albuterol 2 puffs every 4 hours as needed for cough or wheeze OR Instead use albuterol 0.083% solution via nebulizer one unit vial every 4 hours as needed for cough or wheeze Follow-up with your pulmonologist.    Rhinitis Continue levocetirizine 5 mg once a day as needed for a runny nose Begin Nasocort 2 sprays in each nostril once a day as needed for a stuffy nose.  In the right nostril, point the applicator out toward the right ear. In the left nostril, point the applicator out toward the left ear.  Stop this medication if you experience any nosebleeds Continue saline nasal rinses as needed for nasal symptoms. Use this before any medicated nasal sprays for best result For dry nostrils, begin nasal saline gel as needed  Allergic conjunctivitis Begin Pataday eye drop one drop in each eye once a day as needed for red, itchy eyes Continue your dry eye drop from your eye doctor  Urticaria Continue hydroxyzine 25 mg once a day as needed for itch Continue Xolair once every 2 weeks If your symptoms re-occur, begin a journal of events that occurred for up to 6 hours before your symptoms began including foods and beverages consumed, soaps or perfumes you had contact with, and medications.   Food allergy Continue to avoid beans, peanuts, onion, and red dye. In case of an allergic reaction, take Benadryl 50 mg every 4 hours, and if life-threatening symptoms occur, inject with EpiPen 0.3 mg. Make an appointment to return to the clinic for food allergy testing if you are interested.  Remember to stop antihistamines for 3 days before this appointment Call the clinic if this treatment plan is not working  well for you  Follow up in 1 month or sooner if needed.

## 2021-04-19 NOTE — Progress Notes (Signed)
Can you please let this patient know that her chest xray was clear. Please call her in prednisone 10 mg tablets. Take 2 tablets twice a day for 3 days, then take 2 tablets once a day for 1 day, then take 1 tablet on the 5th day, then stop. Please have her call the clinic with any worsening of symptoms. Thank you

## 2021-04-19 NOTE — Progress Notes (Signed)
[address] [place] [address] Dept: [phone]  FOLLOW UP NOTE  Patient ID: Katie Gates, female    DOB: [birth year]  Age: 55 y.o. MRN: [id number] Date of Office Visit: 04/19/2021  Assessment  Chief Complaint: Asthma  HPI Katie Gates is a 55 year old female who presents to the clinic for follow-up visit.  She was last seen in this clinic on 07/13/2020 for evaluation of asthma, allergic rhinitis, allergic conjunctivitis, urticaria, and food allergy to peanut, onion, red dye, beans, and corn.  In the interim, she had a round of prednisone in addition to azithromycin beginning on 03/30/2021 for asthma with acute exacerbation.  At today's visit, she reports her asthma has been poorly controlled over the last several weeks with symptoms including shortness of breath with activity, wheezing several days a week mostly occurring at nighttime, and cough occurring when outside or exposure to perfumes or powders.  She continues Symbicort 160-2 puffs twice a day, Spiriva 2 puffs once a day, and montelukast 10 mg once a day.  She continues albuterol several times a day which is her normal use.  Allergic rhinitis is reported as moderately well controlled with symptoms including occasional clear rhinorrhea and postnasal drainage which she continuously coughs out of her trach.  She reports this drainage has changed in color from thick and green to clear at this time.  She continues levocetirizine 5 mg once a day and azelastine 2 sprays in each nostril twice a day.  She is not currently using a nasal saline rinse or nasal steroid spray.  Allergic conjunctivitis is reported as moderately well controlled with red and itchy eyes occurring especially after cutting the grass.  She is not currently using an allergy eyedrop.  Urticaria is reported as well controlled with no breakouts of hives since her last visit to this clinic.  She continues Xolair injections once every 2 weeks with no large or local  reactions and hydroxyzine as needed.  Reflux is reported as well controlled with famotidine and omeprazole daily.  She continues to avoid peanut, onion, red dye, beans, corn with no accidental ingestion or EpiPen use since her last visit to this clinic.  Her current medications are listed in the chart.   Drug Allergies:  Allergies  Allergen Reactions   Other Itching and Hives    Avoids corn kernels alone, can have corn as an ingredient. MM, RD 08/15/16   Peanut-Containing Drug Products Anaphylaxis   Red Dye Hives   Bean Pod Extract Hives    Avoids kidney beans   Fluticasone Other (See Comments)    NOSE BLEEDS   Garlic Other (See Comments)    STARTS ASTHMA ATTACKS   Hydroxyzine Itching    Can take Benadryl and other antihistamines fine   Keflex [Cephalexin]    Onion Other (See Comments)    RAW ONION=Headache   Robitussin (Alcohol Free) [Guaifenesin]    Tomato Itching    Physical Exam: BP 110/70 (BP Location: Right Arm, Patient Position: Sitting, Cuff Size: Normal)   Pulse 100   Temp 97.7 F (36.5 C) (Temporal)   Resp 20   Ht 5' 7.32" (1.71 m)   Wt 182 lb 9.6 oz (82.8 kg)   BMI 28.33 kg/m    Physical Exam Vitals reviewed.  Constitutional:      Appearance: Normal appearance.  HENT:     Head: Normocephalic and atraumatic.     Right Ear: Tympanic membrane normal.     Left Ear: Tympanic membrane normal.  Nose:     Comments: Bilateral nares edematous and pale with clear nasal drainage noted.  Pharynx normal.  Ears normal.  Eyes normal.    Mouth/Throat:     Pharynx: Oropharynx is clear.  Eyes:     Conjunctiva/sclera: Conjunctivae normal.  Cardiovascular:     Rate and Rhythm: Normal rate and regular rhythm.     Heart sounds: Normal heart sounds. No murmur heard. Pulmonary:     Effort: Pulmonary effort is normal.     Breath sounds: Normal breath sounds.     Comments: Lungs clear to auscultation Musculoskeletal:        General: Normal range of motion.     Cervical  back: Normal range of motion and neck supple.  Skin:    General: Skin is warm and dry.  Neurological:     Mental Status: She is alert and oriented to person, place, and time.  Psychiatric:        Mood and Affect: Mood normal.        Behavior: Behavior normal.        Thought Content: Thought content normal.        Judgment: Judgment normal.    Diagnostics: FVC 1.62, FEV1 1.50.  Predicted FVC 3.07, predicted FEV1 2.43.  Spirometry indicates moderate restriction.  Postbronchodilator FVC 3.04, FEV1 2.54.  Postbronchodilator spirometry indicates normal ventilatory function with marked improvement postbronchodilator therapy.  Assessment and Plan: 1. Cough   2. Moderate persistent asthma with acute exacerbation   3. Chronic urticaria   4. Gastroesophageal reflux disease, unspecified whether esophagitis present   5. Seasonal allergic conjunctivitis   6. Multiple food allergies   7. Tracheostomy in place [facility])     Meds ordered this encounter  Medications   predniSONE (DELTASONE) 10 MG tablet    Sig: Begin prednisone 10 mg tablets. Take 2 tablets twice a day for 3 days, then take 2 tablets once a day for 1 day, then take 1 tablet on the 5th day, then stop    Dispense:  15 tablet    Refill:  0     Patient Instructions  Asthma  Get a chest xray. We will call you with the result when it becomes available Continue Symbicort 160- to 2 puffs twice a day to prevent cough or wheeze Continue montelukast 10 mg once a day to prevent cough or wheeze Continue Incruse Ellipta one puff once a day to prevent cough and wheeze Continue albuterol 2 puffs every 4 hours as needed for cough or wheeze OR Instead use albuterol 0.083% solution via nebulizer one unit vial every 4 hours as needed for cough or wheeze Follow-up with your pulmonologist.    Rhinitis Continue levocetirizine 5 mg once a day as needed for a runny nose Begin Nasocort 2 sprays in each nostril once a day as needed for a stuffy nose.   In the right nostril, point the applicator out toward the right ear. In the left nostril, point the applicator out toward the left ear.  Stop this medication if you experience any nosebleeds Continue saline nasal rinses as needed for nasal symptoms. Use this before any medicated nasal sprays for best result For dry nostrils, begin nasal saline gel as needed  Allergic conjunctivitis Begin Pataday eye drop one drop in each eye once a day as needed for red, itchy eyes Continue your dry eye drop from your eye doctor  Urticaria Continue hydroxyzine 25 mg once a day as needed for itch Continue Xolair once every  2 weeks If your symptoms re-occur, begin a journal of events that occurred for up to 6 hours before your symptoms began including foods and beverages consumed, soaps or perfumes you had contact with, and medications.   Food allergy Continue to avoid beans, peanuts, onion, and red dye. In case of an allergic reaction, take Benadryl 50 mg every 4 hours, and if life-threatening symptoms occur, inject with EpiPen 0.3 mg. Make an appointment to return to the clinic for food allergy testing if you are interested.  Remember to stop antihistamines for 3 days before this appointment Call the clinic if this treatment plan is not working well for you  Follow up in 1 month or sooner if needed.  Return in about 4 weeks (around 05/17/2021), or if symptoms worsen or fail to improve.    Thank you for the opportunity to care for this patient.  Please do not hesitate to contact me with questions.  Thermon Leyland, FNP [facility]

## 2021-04-19 NOTE — Telephone Encounter (Signed)
Spoke with pt. And results was given, pt verbalized understanding.  And Rx for prednisone sent in by anne. Pt aware.

## 2021-05-03 ENCOUNTER — Ambulatory Visit (INDEPENDENT_AMBULATORY_CARE_PROVIDER_SITE_OTHER): Payer: Medicare Other

## 2021-05-03 ENCOUNTER — Other Ambulatory Visit: Payer: Self-pay

## 2021-05-03 DIAGNOSIS — L501 Idiopathic urticaria: Secondary | ICD-10-CM

## 2021-05-10 ENCOUNTER — Other Ambulatory Visit: Payer: Self-pay

## 2021-05-10 ENCOUNTER — Encounter: Payer: Self-pay | Admitting: Family Medicine

## 2021-05-10 ENCOUNTER — Ambulatory Visit (INDEPENDENT_AMBULATORY_CARE_PROVIDER_SITE_OTHER): Payer: Medicare Other | Admitting: Family Medicine

## 2021-05-10 ENCOUNTER — Other Ambulatory Visit: Payer: Self-pay | Admitting: Family Medicine

## 2021-05-10 VITALS — BP 134/86 | HR 105 | Resp 20

## 2021-05-10 DIAGNOSIS — J310 Chronic rhinitis: Secondary | ICD-10-CM | POA: Diagnosis not present

## 2021-05-10 DIAGNOSIS — J4540 Moderate persistent asthma, uncomplicated: Secondary | ICD-10-CM

## 2021-05-10 DIAGNOSIS — T781XXD Other adverse food reactions, not elsewhere classified, subsequent encounter: Secondary | ICD-10-CM

## 2021-05-10 DIAGNOSIS — Z930 Tracheostomy status: Secondary | ICD-10-CM

## 2021-05-10 DIAGNOSIS — K219 Gastro-esophageal reflux disease without esophagitis: Secondary | ICD-10-CM

## 2021-05-10 DIAGNOSIS — L508 Other urticaria: Secondary | ICD-10-CM | POA: Diagnosis not present

## 2021-05-10 DIAGNOSIS — H1013 Acute atopic conjunctivitis, bilateral: Secondary | ICD-10-CM | POA: Diagnosis not present

## 2021-05-10 MED ORDER — EPINEPHRINE 0.3 MG/0.3ML IJ SOAJ: 0.3000 mg | INTRAMUSCULAR | 2 refills | Status: DC | PRN

## 2021-05-10 MED ORDER — YUPELRI 175 MCG/3ML IN SOLN: 175.0000 ug | Freq: Every day | RESPIRATORY_TRACT | 5 refills | Status: DC

## 2021-05-10 MED ORDER — FORMOTEROL FUMARATE 20 MCG/2ML IN NEBU: 20.0000 ug | INHALATION_SOLUTION | Freq: Two times a day (BID) | RESPIRATORY_TRACT | 5 refills | Status: DC

## 2021-05-10 MED ORDER — BUDESONIDE 0.5 MG/2ML IN SUSP: 0.5000 mg | Freq: Two times a day (BID) | RESPIRATORY_TRACT | 5 refills | Status: DC

## 2021-05-10 NOTE — Patient Instructions (Addendum)
Asthma  Begin budesonide 0.5 mg twice a day via nebulizer to prevent cough or wheeze Begin Perforomist 20 mcg twice a day via nebulizer to prevent cough or wheeze Begin Yupelri 175 mcg once a day via nebulizer to prevent cough or wheeze The budesonide, Perforomist, and Yupelri will replace Symbicort and Spiriva. In the meantime, until you get the nebulized medications, continue Symbicort 160-2 puffs twice a day and Incruse Ellipta one puff once a day Continue montelukast 10 mg once a day to prevent cough or wheeze Continue albuterol 2 puffs every 4 hours as needed for cough or wheeze OR Instead use albuterol 0.083% solution via nebulizer one unit vial every 4 hours as needed for cough or wheeze Follow-up with your pulmonologist, Dr. Su Monks   Rhinitis Continue levocetirizine 5 mg once a day as needed for a runny nose Begin Nasacort 2 sprays in each nostril once a day as needed for a stuffy nose.  In the right nostril, point the applicator out toward the right ear. In the left nostril, point the applicator out toward the left ear.  Stop this medication if you experience any nosebleeds Continue azelastine 2 sprays in each nostril twice a day as needed for a runny nose or drainage.  Continue saline nasal rinses as needed for nasal symptoms. Use this before any medicated nasal sprays for best result For dry nostrils, begin nasal saline gel as needed  Allergic conjunctivitis Begin Pataday eye drop one drop in each eye once a day as needed for red, itchy eyes Continue your dry eye drop from your eye doctor  Urticaria Continue hydroxyzine 25 mg once a day as needed for itch Continue Xolair once every 2 weeks and have access to an epinephrine auto-injector set If your symptoms re-occur, begin a journal of events that occurred for up to 6 hours before your symptoms began including foods and beverages consumed, soaps or perfumes you had contact with, and medications.   Reflux Continue dietary and  lifestyle modifications as listed below Continue omeprazole 40 mg twice a day as previously prescribed by your GI specialist  Food allergy Continue to avoid beans, peanuts, onion, and red dye. In case of an allergic reaction, take Benadryl 50 mg every 4 hours, and if life-threatening symptoms occur, inject with EpiPen 0.3 mg.  Call the clinic if this treatment plan is not working well for you  Follow up in 1 month or sooner if needed.

## 2021-05-10 NOTE — Progress Notes (Signed)
By  [address] [place] [address] Dept: [phone]  FOLLOW UP NOTE  Patient ID: Katie Gates, female    DOB: November 17, 1965  Age: 55 y.o. MRN: [id number] Date of Office Visit: 05/10/2021  Assessment  Chief Complaint: Asthma and Cough (Burning in chest when coughing)  HPI Katie Gates is a 55 year old female who presents to the clinic for follow-up visit.  She was last seen in this clinic on 04/19/2021 for evaluation of asthma with acute exacerbation, chronic rhinitis, allergic conjunctivitis, urticaria, and food allergy to beans, peanuts, onion, and red dye.  At today's visit, she reports that her symptoms have not improved much from her last visit.  She reports her asthma has been poorly controlled with symptoms including shortness of breath with rest and activity, wheeze with rest and activity, and cough producing clear mucus from her trach.  She continues montelukast 10 mg once a day, Symbicort 160-2 puffs twice a day, Incruse Ellipta 1 puff once a day, and multiple albuterol administrations throughout each day.  She reports difficulty with inhaling asthma medications and and keeping them in her lungs without coughing.  She does report successful administration with her nebulized albuterol.  Allergic rhinitis is reported as moderately well controlled with clear rhinorrhea, nasal congestion, intermittent frontal headache, sneezing, and postnasal drainage.  She continues levocetirizine 10 mg once a day, Nasacort 2 sprays in each nostril once a day, azelastine 2 sprays in each nostril twice a day, and Novage nasal rinses daily.  She has currently run out of of azelastine which she reports works very well for her.  She is not currently using a nasal saline gel.  Allergic conjunctivitis is reported as moderately well controlled with occasional red and itchy eyes for which she is not using any medical intervention at this time.  Urticaria is reported as moderately well controlled with 1  incidence of hives occurring after eating food containing peanut butter.  She reports symptoms including hives, itching, and vomiting for which she took Benadryl with resolution of symptoms.  Other than that incident, she continues to avoid beans, peanuts, onion, and red dye.  Reflux is reported as moderately well controlled with occasional heartburn.  She continues omeprazole 40 mg twice a day.  She continues to follow-up with [facility].  Her current medications are listed in the chart.   Drug Allergies:  Allergies  Allergen Reactions   Other Itching and Hives    Avoids corn kernels alone, can have corn as an ingredient. MM, RD 08/15/16   Peanut-Containing Drug Products Anaphylaxis   Red Dye Hives   Bean Pod Extract Hives    Avoids kidney beans   Fluticasone Other (See Comments)    NOSE BLEEDS   Garlic Other (See Comments)    STARTS ASTHMA ATTACKS   Hydroxyzine Itching    Can take Benadryl and other antihistamines fine   Keflex [Cephalexin]    Onion Other (See Comments)    RAW ONION=Headache   Robitussin (Alcohol Free) [Guaifenesin]    Tomato Itching    Physical Exam: BP 134/86   Pulse (!) 105   Resp 20   SpO2 97%    Physical Exam Vitals reviewed.  Constitutional:      Appearance: Normal appearance.  HENT:     Head: Normocephalic and atraumatic.     Right Ear: Tympanic membrane normal.     Left Ear: Tympanic membrane normal.     Nose:     Comments:  Bilateral nares erythematous with clear nasal drainage noted.  Pharynx normal.  Ears normal.  Eyes normal.  Tracheostomy remains in place    Mouth/Throat:     Pharynx: Oropharynx is clear.  Eyes:     Conjunctiva/sclera: Conjunctivae normal.  Cardiovascular:     Rate and Rhythm: Normal rate and regular rhythm.     Heart sounds: Normal heart sounds. No murmur heard. Pulmonary:     Effort: Pulmonary effort is normal.     Breath sounds: Normal breath sounds.     Comments:  Lungs clear to auscultation.  Frequent coughing producing clear mucus via trach throughout the patient interview Musculoskeletal:        General: Normal range of motion.     Cervical back: Normal range of motion and neck supple.  Skin:    General: Skin is warm and dry.  Neurological:     Mental Status: She is alert and oriented to person, place, and time.  Psychiatric:        Mood and Affect: Mood normal.        Behavior: Behavior normal.        Thought Content: Thought content normal.        Judgment: Judgment normal.    Diagnostics: FVC 2.11, FEV1 1.21.  Predicted FVC 3.07, predicted FEV1 2.43.  Spirometry indicates mild restriction and moderate obstruction.  Patient unable to provide reproducible effort due to frequent coughing.  Assessment and Plan: 1. Not well controlled moderate persistent asthma   2. Chronic rhinitis   3. Allergic conjunctivitis of both eyes   4. Chronic urticaria   5. Multiple food allergies   6. Gastroesophageal reflux disease, unspecified whether esophagitis present   7. Tracheostomy in place [facility])     Meds ordered this encounter  Medications   budesonide (PULMICORT) 0.5 MG/2ML nebulizer solution    Sig: Take 2 mLs (0.5 mg total) by nebulization 2 (two) times daily.    Dispense:  2 mL    Refill:  5   formoterol (PERFOROMIST) 20 MCG/2ML nebulizer solution    Sig: Take 2 mLs (20 mcg total) by nebulization 2 (two) times daily.    Dispense:  120 mL    Refill:  5    PROCESS AS PART B   revefenacin (YUPELRI) 175 MCG/3ML nebulizer solution    Sig: Take 3 mLs (175 mcg total) by nebulization daily.    Dispense:  90 mL    Refill:  5   EPINEPHrine 0.3 mg/0.3 mL IJ SOAJ injection    Sig: Inject 0.3 mg into the muscle as needed for anaphylaxis.    Dispense:  2 each    Refill:  2     Patient Instructions  Asthma  Begin budesonide 0.5 mg twice a day via nebulizer to prevent cough or wheeze Begin Perforomist 20 mcg twice a day via nebulizer to prevent  cough or wheeze Begin Yupelri 175 mcg once a day via nebulizer to prevent cough or wheeze The budesonide, Perforomist, and Yupelri will replace Symbicort and Spiriva. In the meantime, until you get the nebulized medications, continue Symbicort 160-2 puffs twice a day and Incruse Ellipta one puff once a day Continue montelukast 10 mg once a day to prevent cough or wheeze Continue albuterol 2 puffs every 4 hours as needed for cough or wheeze OR Instead use albuterol 0.083% solution via nebulizer one unit vial every 4 hours as needed for cough or wheeze Follow-up with your pulmonologist, Dr. Su Monks   Rhinitis Continue levocetirizine  5 mg once a day as needed for a runny nose Begin Nasacort 2 sprays in each nostril once a day as needed for a stuffy nose.  In the right nostril, point the applicator out toward the right ear. In the left nostril, point the applicator out toward the left ear.  Stop this medication if you experience any nosebleeds Continue azelastine 2 sprays in each nostril twice a day as needed for a runny nose or drainage.  Continue saline nasal rinses as needed for nasal symptoms. Use this before any medicated nasal sprays for best result For dry nostrils, begin nasal saline gel as needed  Allergic conjunctivitis Begin Pataday eye drop one drop in each eye once a day as needed for red, itchy eyes Continue your dry eye drop from your eye doctor  Urticaria Continue hydroxyzine 25 mg once a day as needed for itch Continue Xolair once every 2 weeks and have access to an epinephrine auto-injector set If your symptoms re-occur, begin a journal of events that occurred for up to 6 hours before your symptoms began including foods and beverages consumed, soaps or perfumes you had contact with, and medications.   Reflux Continue dietary and lifestyle modifications as listed below Continue omeprazole 40 mg twice a day as previously prescribed by your GI specialist  Food allergy Continue to  avoid beans, peanuts, onion, and red dye. In case of an allergic reaction, take Benadryl 50 mg every 4 hours, and if life-threatening symptoms occur, inject with EpiPen 0.3 mg.  Call the clinic if this treatment plan is not working well for you  Follow up in 1 month or sooner if needed.  Return in about 4 weeks (around 06/07/2021), or if symptoms worsen or fail to improve.    Thank you for the opportunity to care for this patient.  Please do not hesitate to contact me with questions.  Thermon Leyland, FNP [facility]

## 2021-05-17 ENCOUNTER — Other Ambulatory Visit: Payer: Self-pay

## 2021-05-17 ENCOUNTER — Ambulatory Visit (INDEPENDENT_AMBULATORY_CARE_PROVIDER_SITE_OTHER): Payer: Medicare Other

## 2021-05-17 DIAGNOSIS — L501 Idiopathic urticaria: Secondary | ICD-10-CM | POA: Diagnosis not present

## 2021-05-18 ENCOUNTER — Other Ambulatory Visit: Payer: Self-pay | Admitting: Family Medicine

## 2021-05-31 ENCOUNTER — Ambulatory Visit (INDEPENDENT_AMBULATORY_CARE_PROVIDER_SITE_OTHER): Payer: Medicare Other

## 2021-05-31 ENCOUNTER — Other Ambulatory Visit: Payer: Self-pay

## 2021-05-31 DIAGNOSIS — L501 Idiopathic urticaria: Secondary | ICD-10-CM | POA: Diagnosis not present

## 2021-05-31 DIAGNOSIS — L508 Other urticaria: Secondary | ICD-10-CM

## 2021-06-05 ENCOUNTER — Other Ambulatory Visit: Payer: Self-pay | Admitting: Allergy & Immunology

## 2021-06-05 NOTE — Telephone Encounter (Signed)
Please advise 

## 2021-06-12 ENCOUNTER — Ambulatory Visit: Payer: Medicare Other | Admitting: Family Medicine

## 2021-06-12 ENCOUNTER — Other Ambulatory Visit: Payer: Self-pay

## 2021-06-12 ENCOUNTER — Encounter: Payer: Self-pay | Admitting: Family Medicine

## 2021-06-12 ENCOUNTER — Ambulatory Visit (INDEPENDENT_AMBULATORY_CARE_PROVIDER_SITE_OTHER): Payer: Medicare Other

## 2021-06-12 VITALS — BP 116/72 | HR 88 | Temp 98.1°F | Resp 17 | Ht 66.5 in | Wt 185.0 lb

## 2021-06-12 DIAGNOSIS — H1013 Acute atopic conjunctivitis, bilateral: Secondary | ICD-10-CM | POA: Diagnosis not present

## 2021-06-12 DIAGNOSIS — T781XXD Other adverse food reactions, not elsewhere classified, subsequent encounter: Secondary | ICD-10-CM

## 2021-06-12 DIAGNOSIS — K219 Gastro-esophageal reflux disease without esophagitis: Secondary | ICD-10-CM

## 2021-06-12 DIAGNOSIS — L508 Other urticaria: Secondary | ICD-10-CM

## 2021-06-12 DIAGNOSIS — J310 Chronic rhinitis: Secondary | ICD-10-CM

## 2021-06-12 DIAGNOSIS — Z930 Tracheostomy status: Secondary | ICD-10-CM

## 2021-06-12 DIAGNOSIS — L501 Idiopathic urticaria: Secondary | ICD-10-CM

## 2021-06-12 DIAGNOSIS — J4540 Moderate persistent asthma, uncomplicated: Secondary | ICD-10-CM

## 2021-06-12 NOTE — Patient Instructions (Addendum)
Asthma  Begin budesonide 0.5 mg twice a day via nebulizer to prevent cough or wheeze Begin Perforomist 20 mcg twice a day via nebulizer to prevent cough or wheeze Begin Yupelri 175 mcg once a day via nebulizer to prevent cough or wheeze The budesonide, Perforomist, and Yupelri will replace Symbicort and Spiriva. In the meantime, until you get the nebulized medications, continue Symbicort 160-2 puffs twice a day and Incruse Ellipta one puff once a day Continue montelukast 10 mg once a day to prevent cough or wheeze Continue albuterol 2 puffs every 4 hours as needed for cough or wheeze OR Instead use albuterol 0.083% solution via nebulizer one unit vial every 4 hours as needed for cough or wheeze Follow-up with your pulmonologist, Dr. Su Monks   Rhinitis Continue levocetirizine 5 mg once a day as needed for a runny nose Begin Nasacort 2 sprays in each nostril once a day as needed for a stuffy nose.  In the right nostril, point the applicator out toward the right ear. In the left nostril, point the applicator out toward the left ear.  Stop this medication if you experience any nosebleeds Continue azelastine 2 sprays in each nostril twice a day as needed for a runny nose or drainage.  Continue saline nasal rinses as needed for nasal symptoms. Use this before any medicated nasal sprays for best result For dry nostrils, begin nasal saline gel as needed  Allergic conjunctivitis Begin Pataday eye drop one drop in each eye once a day as needed for red, itchy eyes Continue your dry eye drop from your eye doctor  Urticaria Continue hydroxyzine 25 mg once a day as needed for itch Continue Xolair once every 2 weeks and have access to an epinephrine auto-injector set If your symptoms re-occur, begin a journal of events that occurred for up to 6 hours before your symptoms began including foods and beverages consumed, soaps or perfumes you had contact with, and medications.   Reflux Continue dietary and  lifestyle modifications as listed below Continue omeprazole 40 mg twice a day as previously prescribed by your GI specialist  Food allergy Continue to avoid beans, peanuts, onion, and red dye. In case of an allergic reaction, take Benadryl 50 mg every 4 hours, and if life-threatening symptoms occur, inject with EpiPen 0.3 mg.  Call the clinic if this treatment plan is not working well for you  Follow up in 4 months or sooner if needed.

## 2021-06-12 NOTE — Progress Notes (Signed)
[address] [place] [address] Dept: [phone]  FOLLOW UP NOTE  Patient ID: Katie Gates, female    DOB: 1966/05/23  Age: 55 y.o. MRN: [id number] Date of Office Visit: 06/12/2021  Assessment  Chief Complaint: Follow-up (Pt states asthma symptoms have been well managed, occasional she have night time coughing.) and Asthma  HPI Katie Gates is a 55 year old female who presents the clinic for follow-up visit.  She was last seen in this clinic on 05/10/2021 for evaluation of asthma, chronic rhinitis, allergic conjunctivitis, chronic urticaria, reflux, and food allergy to beans, peanut, onion, and red dye.  At today's visit, she reports her asthma has been moderately well controlled with occasional shortness of breath when exposed to odors and cough producing clear to light yellow secretions from her trach.  She denies wheezing.  She continues montelukast 10 mg once a day, budesonide 0.5 mg twice a day, Perforomist twice a day, and Yupelri once a day.  She reports that she uses albuterol about once a day which provides relief of symptoms.  Chronic rhinitis is reported as moderately well controlled with nasal congestion as the main symptom.  She reports this is frequently worse on the right side.  She continues levocetirizine, Nasacort, azelastine, and saline rinses through Novage nasal rinsing system.  Chronic urticaria is reported as moderately well controlled with 1 episode of hives occurring once this month and 1 episode of Hives occurring last month.  She reports these hives lasted for a couple hours before completely resolving.  She continues hydroxyzine twice a day and Xolair injections once a month.  She denies large or local reactions after Xolair and reports a significant decrease in her symptoms of chronic urticaria while continuing on Xolair injections.  She continues to avoid beans, peanut, onion and red dye with no accidental ingestion or EpiPen use since her last visit to this  clinic.  Reflux is reported as well controlled with omeprazole 40 mg once a day.  She continues to follow her GI specialist for evaluation and treatment of reflux.  Her current medications are listed in the chart.   Drug Allergies:  Allergies  Allergen Reactions   Other Itching and Hives    Avoids corn kernels alone, can have corn as an ingredient. MM, RD 08/15/16   Peanut-Containing Drug Products Anaphylaxis   Red Dye Hives   Bean Pod Extract Hives    Avoids kidney beans   Fluticasone Other (See Comments)    NOSE BLEEDS   Garlic Other (See Comments)    STARTS ASTHMA ATTACKS   Hydroxyzine Itching    Can take Benadryl and other antihistamines fine   Keflex [Cephalexin]    Onion Other (See Comments)    RAW ONION=Headache   Robitussin (Alcohol Free) [Guaifenesin]    Tomato Itching    Physical Exam: BP 116/72   Pulse 88   Temp 98.1 F (36.7 C) (Temporal)   Resp 17   Ht 5' 6.5" (1.689 m)   Wt 185 lb (83.9 kg)   SpO2 98%   BMI 29.41 kg/m    Physical Exam Vitals reviewed.  Constitutional:      Appearance: Normal appearance.  HENT:     Head: Normocephalic and atraumatic.     Nose:     Comments: Bilateral nares edematous and pale with right greater than left.  Clear nasal drainage noted.  Pharynx normal.  Ears normal.  Eyes normal.  Tracheostomy remains in place and patent    Mouth/Throat:  Pharynx: Oropharynx is clear.  Eyes:     Conjunctiva/sclera: Conjunctivae normal.  Cardiovascular:     Rate and Rhythm: Normal rate and regular rhythm.     Heart sounds: Normal heart sounds. No murmur heard. Pulmonary:     Effort: Pulmonary effort is normal.     Breath sounds: Normal breath sounds.     Comments: Lungs clear to auscultation. Musculoskeletal:        General: Normal range of motion.     Cervical back: Normal range of motion and neck supple.  Skin:    General: Skin is warm and dry.  Neurological:     Mental Status: She is alert and oriented to person, place, and  time.  Psychiatric:        Mood and Affect: Mood normal.        Behavior: Behavior normal.        Thought Content: Thought content normal.        Judgment: Judgment normal.    Diagnostics: FVC 1.78, FEV1 1.46.  Predicted FVC 3.02, predicted FEV1 2.39.  Spirometry indicates moderate restriction.  This is consistent with previous spirometry readings.  Assessment and Plan: 1. Moderate persistent asthma without complication   2. Chronic rhinitis   3. Allergic conjunctivitis of both eyes   4. Chronic urticaria   5. Gastroesophageal reflux disease, unspecified whether esophagitis present   6. Tracheostomy in place (HCC)   7. Multiple food allergies      Patient Instructions  Asthma  Begin budesonide 0.5 mg twice a day via nebulizer to prevent cough or wheeze Begin Perforomist 20 mcg twice a day via nebulizer to prevent cough or wheeze Begin Yupelri 175 mcg once a day via nebulizer to prevent cough or wheeze The budesonide, Perforomist, and Yupelri will replace Symbicort and Spiriva. In the meantime, until you get the nebulized medications, continue Symbicort 160-2 puffs twice a day and Incruse Ellipta one puff once a day Continue montelukast 10 mg once a day to prevent cough or wheeze Continue albuterol 2 puffs every 4 hours as needed for cough or wheeze OR Instead use albuterol 0.083% solution via nebulizer one unit vial every 4 hours as needed for cough or wheeze Follow-up with your pulmonologist, Dr. Su Monks   Rhinitis Continue levocetirizine 5 mg once a day as needed for a runny nose Begin Nasacort 2 sprays in each nostril once a day as needed for a stuffy nose.  In the right nostril, point the applicator out toward the right ear. In the left nostril, point the applicator out toward the left ear.  Stop this medication if you experience any nosebleeds Continue azelastine 2 sprays in each nostril twice a day as needed for a runny nose or drainage.  Continue saline nasal rinses as needed  for nasal symptoms. Use this before any medicated nasal sprays for best result For dry nostrils, begin nasal saline gel as needed  Allergic conjunctivitis Begin Pataday eye drop one drop in each eye once a day as needed for red, itchy eyes Continue your dry eye drop from your eye doctor  Urticaria Continue hydroxyzine 25 mg once a day as needed for itch Continue Xolair once every 2 weeks and have access to an epinephrine auto-injector set If your symptoms re-occur, begin a journal of events that occurred for up to 6 hours before your symptoms began including foods and beverages consumed, soaps or perfumes you had contact with, and medications.   Reflux Continue dietary and lifestyle modifications as listed  below Continue omeprazole 40 mg twice a day as previously prescribed by your GI specialist  Food allergy Continue to avoid beans, peanuts, onion, and red dye. In case of an allergic reaction, take Benadryl 50 mg every 4 hours, and if life-threatening symptoms occur, inject with EpiPen 0.3 mg.  Call the clinic if this treatment plan is not working well for you  Follow up in 4 months or sooner if needed.  Return in about 4 months (around 10/10/2021), or if symptoms worsen or fail to improve.    Thank you for the opportunity to care for this patient.  Please do not hesitate to contact me with questions.  Thermon Leyland, FNP [facility]

## 2021-06-14 ENCOUNTER — Ambulatory Visit: Payer: Medicare Other

## 2021-06-17 ENCOUNTER — Other Ambulatory Visit: Payer: Self-pay | Admitting: Family Medicine

## 2021-06-27 ENCOUNTER — Ambulatory Visit (INDEPENDENT_AMBULATORY_CARE_PROVIDER_SITE_OTHER): Payer: Medicare Other

## 2021-06-27 ENCOUNTER — Other Ambulatory Visit: Payer: Self-pay

## 2021-06-27 DIAGNOSIS — L501 Idiopathic urticaria: Secondary | ICD-10-CM | POA: Diagnosis not present

## 2021-07-11 ENCOUNTER — Ambulatory Visit (INDEPENDENT_AMBULATORY_CARE_PROVIDER_SITE_OTHER): Payer: Medicare Other

## 2021-07-11 ENCOUNTER — Other Ambulatory Visit: Payer: Self-pay

## 2021-07-11 DIAGNOSIS — L501 Idiopathic urticaria: Secondary | ICD-10-CM | POA: Diagnosis not present

## 2021-07-25 ENCOUNTER — Ambulatory Visit (INDEPENDENT_AMBULATORY_CARE_PROVIDER_SITE_OTHER): Payer: Medicare Other

## 2021-07-25 ENCOUNTER — Other Ambulatory Visit: Payer: Self-pay

## 2021-07-25 DIAGNOSIS — L501 Idiopathic urticaria: Secondary | ICD-10-CM

## 2021-07-28 ENCOUNTER — Other Ambulatory Visit: Payer: Self-pay | Admitting: Family Medicine

## 2021-08-07 ENCOUNTER — Other Ambulatory Visit: Payer: Self-pay

## 2021-08-07 ENCOUNTER — Ambulatory Visit: Payer: Self-pay

## 2021-08-07 ENCOUNTER — Ambulatory Visit (INDEPENDENT_AMBULATORY_CARE_PROVIDER_SITE_OTHER): Payer: Medicare Other

## 2021-08-07 DIAGNOSIS — L501 Idiopathic urticaria: Secondary | ICD-10-CM

## 2021-08-08 ENCOUNTER — Ambulatory Visit: Payer: Medicare Other

## 2021-08-21 ENCOUNTER — Ambulatory Visit (INDEPENDENT_AMBULATORY_CARE_PROVIDER_SITE_OTHER): Payer: Medicare Other | Admitting: *Deleted

## 2021-08-21 DIAGNOSIS — L501 Idiopathic urticaria: Secondary | ICD-10-CM

## 2021-08-26 ENCOUNTER — Other Ambulatory Visit: Payer: Self-pay | Admitting: Family Medicine

## 2021-09-04 ENCOUNTER — Ambulatory Visit (INDEPENDENT_AMBULATORY_CARE_PROVIDER_SITE_OTHER): Payer: Medicare Other

## 2021-09-04 ENCOUNTER — Other Ambulatory Visit: Payer: Self-pay

## 2021-09-04 DIAGNOSIS — L501 Idiopathic urticaria: Secondary | ICD-10-CM | POA: Diagnosis not present

## 2021-09-05 ENCOUNTER — Other Ambulatory Visit: Payer: Self-pay | Admitting: Family Medicine

## 2021-09-05 NOTE — Telephone Encounter (Signed)
Do you want the patient to be on Katie Gates?

## 2021-09-06 NOTE — Telephone Encounter (Signed)
Please deny this request. Thank you

## 2021-09-19 ENCOUNTER — Other Ambulatory Visit: Payer: Self-pay

## 2021-09-19 ENCOUNTER — Ambulatory Visit (INDEPENDENT_AMBULATORY_CARE_PROVIDER_SITE_OTHER): Payer: Medicare Other

## 2021-09-19 DIAGNOSIS — L501 Idiopathic urticaria: Secondary | ICD-10-CM | POA: Diagnosis not present

## 2021-10-03 ENCOUNTER — Other Ambulatory Visit: Payer: Self-pay

## 2021-10-03 ENCOUNTER — Ambulatory Visit (INDEPENDENT_AMBULATORY_CARE_PROVIDER_SITE_OTHER): Payer: Medicare Other

## 2021-10-03 DIAGNOSIS — L501 Idiopathic urticaria: Secondary | ICD-10-CM | POA: Diagnosis not present

## 2021-10-17 ENCOUNTER — Ambulatory Visit (INDEPENDENT_AMBULATORY_CARE_PROVIDER_SITE_OTHER): Payer: Medicare Other

## 2021-10-17 ENCOUNTER — Other Ambulatory Visit: Payer: Self-pay

## 2021-10-17 DIAGNOSIS — L501 Idiopathic urticaria: Secondary | ICD-10-CM

## 2021-10-31 ENCOUNTER — Other Ambulatory Visit: Payer: Self-pay

## 2021-10-31 ENCOUNTER — Encounter: Payer: Self-pay | Admitting: Family Medicine

## 2021-10-31 ENCOUNTER — Other Ambulatory Visit: Payer: Self-pay | Admitting: Family Medicine

## 2021-10-31 ENCOUNTER — Other Ambulatory Visit: Payer: Self-pay | Admitting: *Deleted

## 2021-10-31 ENCOUNTER — Ambulatory Visit (INDEPENDENT_AMBULATORY_CARE_PROVIDER_SITE_OTHER): Payer: Medicare Other

## 2021-10-31 ENCOUNTER — Ambulatory Visit: Payer: Medicare Other | Admitting: Family Medicine

## 2021-10-31 VITALS — BP 108/78 | HR 93 | Temp 98.1°F | Resp 18 | Ht 67.0 in | Wt 187.7 lb

## 2021-10-31 DIAGNOSIS — J4550 Severe persistent asthma, uncomplicated: Secondary | ICD-10-CM

## 2021-10-31 DIAGNOSIS — H1013 Acute atopic conjunctivitis, bilateral: Secondary | ICD-10-CM

## 2021-10-31 DIAGNOSIS — J310 Chronic rhinitis: Secondary | ICD-10-CM | POA: Diagnosis not present

## 2021-10-31 DIAGNOSIS — K219 Gastro-esophageal reflux disease without esophagitis: Secondary | ICD-10-CM

## 2021-10-31 DIAGNOSIS — L508 Other urticaria: Secondary | ICD-10-CM | POA: Diagnosis not present

## 2021-10-31 DIAGNOSIS — Z930 Tracheostomy status: Secondary | ICD-10-CM

## 2021-10-31 DIAGNOSIS — T781XXD Other adverse food reactions, not elsewhere classified, subsequent encounter: Secondary | ICD-10-CM

## 2021-10-31 DIAGNOSIS — L501 Idiopathic urticaria: Secondary | ICD-10-CM | POA: Diagnosis not present

## 2021-10-31 MED ORDER — FORMOTEROL FUMARATE 20 MCG/2ML IN NEBU
20.0000 ug | INHALATION_SOLUTION | Freq: Two times a day (BID) | RESPIRATORY_TRACT | 5 refills | Status: DC
Start: 2021-10-31 — End: 2021-10-31

## 2021-10-31 MED ORDER — BUDESONIDE 0.5 MG/2ML IN SUSP: 0.5000 mg | Freq: Two times a day (BID) | RESPIRATORY_TRACT | 5 refills | Status: DC

## 2021-10-31 MED ORDER — FORMOTEROL FUMARATE 20 MCG/2ML IN NEBU: 20.0000 ug | INHALATION_SOLUTION | Freq: Two times a day (BID) | RESPIRATORY_TRACT | 5 refills | Status: DC

## 2021-10-31 MED ORDER — YUPELRI 175 MCG/3ML IN SOLN: 175.0000 ug | Freq: Every day | RESPIRATORY_TRACT | 5 refills | Status: DC

## 2021-10-31 MED ORDER — BUDESONIDE 0.5 MG/2ML IN SUSP: RESPIRATORY_TRACT | 1 refills | Status: DC

## 2021-10-31 NOTE — Progress Notes (Signed)
? ?[place] [address] ?Dept: [phone] ? ?FOLLOW UP NOTE ? ?Patient ID: Katie Gates, female    DOB: 1966-03-03  Age: 56 y.o. MRN: [id number] ?Date of Office Visit: 10/31/2021 ? ?Assessment  ?Chief Complaint: Asthma and Cough (Pt states she had a dry cough about a week now, with a lot of post nasal drainage.) ? ?HPI ?Katie Gates is a 56 year old female who presents to the clinic for evaluation of allergic rhinitis.  She was last seen in this clinic on 06/12/2021 by Gareth Morgan, FNP, for evaluation of asthma, chronic rhinitis, allergic conjunctivitis, chronic urticaria, reflux, tracheostomy, and multiple food allergies.  At today's visit, she reports her asthma has been moderately well controlled with occasional shortness of breath, occasional wheeze, and cough producing thick clear to yellow and back to clear mucus from her trach.  She continues budesonide, Perforomist, and Yupelri via nebulizer, montelukast 10 mg once a day, and uses albuterol daily on a regular schedule.  Chronic rhinitis is reported as poorly controlled with symptoms including nasal congestion and inflammation, dry nostrils, and thick postnasal drainage.  She denies fever, sweats, chills, and sick contacts.  She continues levocetirizine 5 mg once a day and had been using Nasacort nasal spray with excellent results, however, she is out of this medication. Her last environmental allergy testing was via lab on 09/21/2016 and was negative to the zone 3 environmental panel. She does report occasional epistaxis occurring from both nostrils with relief of symptoms over several minutes.  Allergic conjunctivitis is reported as poorly controlled with symptoms including red and itchy eyes for which she is not currently using a medical intervention.  Urticaria is reported as well controlled with no breakouts since her last visit to this clinic.  She continues hydroxyzine once a day and Xolair injections once every 2 weeks.  She  reports a significant decrease in her symptoms of urticaria while continuing on Xolair.  Reflux is reported as moderately well controlled with heartburn occurring occasionally.  She continues omeprazole 40 mg twice a day as prescribed by her GI specialist.  She continues to avoid beans, peanuts, onion, and red dye with no accidental ingestion or EpiPen use since her last visit to this clinic.  Her current medications are listed in the chart. ? ? ?Drug Allergies:  ?Allergies  ?Allergen Reactions  ? Other Itching and Hives  ?  Avoids corn kernels alone, can have corn as an ingredient. MM, RD 08/15/16  ? Peanut-Containing Drug Products Anaphylaxis  ? Red Dye Hives  ? Bean Pod Extract Hives  ?  Avoids kidney beans  ? Fluticasone Other (See Comments)  ?  NOSE BLEEDS  ? Garlic Other (See Comments)  ?  STARTS ASTHMA ATTACKS  ? Hydroxyzine Itching  ?  Can take Benadryl and other antihistamines fine  ? Keflex [Cephalexin]   ? Onion Other (See Comments)  ?  RAW ONION=Headache  ? Robitussin (Alcohol Free) [Guaifenesin]   ? Tomato Itching  ? ? ?Physical Exam: ?BP 108/78   Pulse 93   Temp 98.1 ?F (36.7 ?C) (Temporal)   Resp 18   Ht 5\' 7"  (1.702 m)   SpO2 98%   BMI 28.98 kg/m?   ? ?Physical Exam ?Vitals reviewed.  ?Constitutional:   ?   Appearance: Normal appearance.  ?HENT:  ?   Head: Normocephalic and atraumatic.  ?   Right Ear: Tympanic membrane normal.  ?   Left Ear: Tympanic membrane normal.  ?   Nose:  ?  Comments: Bilateral nares grossly edematous with no nasal drainage noted.  Pharynx slightly erythematous with no exudate.  Ears normal.  Eyes normal. ?Eyes:  ?   Conjunctiva/sclera: Conjunctivae normal.  ?Cardiovascular:  ?   Rate and Rhythm: Normal rate and regular rhythm.  ?   Heart sounds: Normal heart sounds. No murmur heard. ?Pulmonary:  ?   Effort: Pulmonary effort is normal.  ?   Breath sounds: Normal breath sounds.  ?   Comments: Lungs clear to auscultation ?Musculoskeletal:     ?   General: Normal range of  motion.  ?   Cervical back: Normal range of motion and neck supple.  ?Skin: ?   General: Skin is warm and dry.  ?Neurological:  ?   Mental Status: She is alert and oriented to person, place, and time.  ?Psychiatric:     ?   Mood and Affect: Mood normal.     ?   Behavior: Behavior normal.     ?   Thought Content: Thought content normal.     ?   Judgment: Judgment normal.  ? ? ?Diagnostics: ?Deferred due to cough ? ?Assessment and Plan: ?1. Severe persistent asthma without complication   ?2. Chronic rhinitis   ?3. Allergic conjunctivitis of both eyes   ?4. Chronic urticaria   ?5. Gastroesophageal reflux disease, unspecified whether esophagitis present   ?6. Multiple food allergies   ?7. Tracheostomy in place [facility])   ? ? ?Meds ordered this encounter  ?Medications  ? budesonide (PULMICORT) 0.5 MG/2ML nebulizer solution  ?  Sig: Begin budesonide rinses once a day. Fill the NeilMed bottle with 240 cc saline using distilled water and salt mixture.  Then add the entire 2cc vial of budesonide to the rinse bottle and mix together. Use 1-2 times a day  ?  Dispense:  100 mL  ?  Refill:  1  ? budesonide (PULMICORT) 0.5 MG/2ML nebulizer solution  ?  Sig: Take 2 mLs (0.5 mg total) by nebulization 2 (two) times daily.  ?  Dispense:  50 mL  ?  Refill:  5  ? revefenacin (YUPELRI) 175 MCG/3ML nebulizer solution  ?  Sig: Take 3 mLs (175 mcg total) by nebulization daily.  ?  Dispense:  90 mL  ?  Refill:  5  ?  Dx Code: J45.40  ? formoterol (PERFOROMIST) 20 MCG/2ML nebulizer solution  ?  Sig: Take 2 mLs (20 mcg total) by nebulization 2 (two) times daily.  ?  Dispense:  120 mL  ?  Refill:  5  ?  Dx Code: J45.40  ? ? ?Patient Instructions  ?Asthma  ?Begin budesonide 0.5 mg twice a day via nebulizer to prevent cough or wheeze ?Begin Perforomist 20 mcg twice a day via nebulizer to prevent cough or wheeze ?Begin Yupelri 175 mcg once a day via nebulizer to prevent cough or wheeze ?The budesonide, Perforomist, and Yupelri will replace Symbicort  and Spiriva. In the meantime, until you get the nebulized medications, continue Symbicort 160-2 puffs twice a day and Incruse Ellipta one puff once a day ?Continue montelukast 10 mg once a day to prevent cough or wheeze ?Continue albuterol 2 puffs every 4 hours as needed for cough or wheeze OR Instead use albuterol 0.083% solution via nebulizer one unit vial every 4 hours as needed for cough or wheeze ?Continue to follow-up with your pulmonologist, Dr. Camillo Flaming as recommended ? ?Rhinitis ?Continue levocetirizine 5 mg once a day as needed for a runny nose ?Begin budesonide solution  one vial added to a nasal saline rinse twice a day for nasal congestion for one week, then decrease to once a day  Stop this medication if you experience any nosebleeds ?Continue azelastine 2 sprays in each nostril twice a day as needed for a runny nose or drainage.  ?Continue saline nasal rinses as needed for nasal symptoms. Use this before any medicated nasal sprays for best result ?For dry nostrils, begin nasal saline gel as needed ? ?Allergic conjunctivitis ?Begin Pataday eye drop one drop in each eye once a day as needed for red, itchy eyes ?Continue your dry eye drop from your eye doctor ? ?Urticaria ?Continue hydroxyzine 25 mg once a day as needed for itch ?Continue Xolair once every 2 weeks and have access to an epinephrine auto-injector set ?If your symptoms re-occur, begin a journal of events that occurred for up to 6 hours before your symptoms began including foods and beverages consumed, soaps or perfumes you had contact with, and medications.  ? ?Reflux ?Continue dietary and lifestyle modifications as listed below ?Continue omeprazole 40 mg twice a day as previously prescribed by your GI specialist ? ?Food allergy ?Continue to avoid beans, peanuts, onion, and red dye. In case of an allergic reaction, take Benadryl 50 mg every 4 hours, and if life-threatening symptoms occur, inject with EpiPen 0.3 mg. ? ?Call the clinic if this  treatment plan is not working well for you ? ?Follow up in 1 month or sooner if needed. ? ? ?Return in about 4 weeks (around 11/28/2021), or if symptoms worsen or fail to improve. ?  ? ?Thank you for the opportunity

## 2021-10-31 NOTE — Patient Instructions (Addendum)
Asthma  ?Begin budesonide 0.5 mg twice a day via nebulizer to prevent cough or wheeze ?Begin Perforomist 20 mcg twice a day via nebulizer to prevent cough or wheeze ?Begin Yupelri 175 mcg once a day via nebulizer to prevent cough or wheeze ?The budesonide, Perforomist, and Yupelri will replace Symbicort and Spiriva. In the meantime, until you get the nebulized medications, continue Symbicort 160-2 puffs twice a day and Incruse Ellipta one puff once a day ?Continue montelukast 10 mg once a day to prevent cough or wheeze ?Continue albuterol 2 puffs every 4 hours as needed for cough or wheeze OR Instead use albuterol 0.083% solution via nebulizer one unit vial every 4 hours as needed for cough or wheeze ?Continue to follow-up with your pulmonologist, Dr. Ejaz as recommended ? ?Rhinitis ?Continue levocetirizine 5 mg once a day as needed for a runny nose ?Begin budesonide solution one vial added to a nasal saline rinse twice a day for nasal congestion for one week, then decrease to once a day  Stop this medication if you experience any nosebleeds ?Continue azelastine 2 sprays in each nostril twice a day as needed for a runny nose or drainage.  ?Continue saline nasal rinses as needed for nasal symptoms. Use this before any medicated nasal sprays for best result ?For dry nostrils, begin nasal saline gel as needed ? ?Allergic conjunctivitis ?Begin Pataday eye drop one drop in each eye once a day as needed for red, itchy eyes ?Continue your dry eye drop from your eye doctor ? ?Urticaria ?Continue hydroxyzine 25 mg once a day as needed for itch ?Continue Xolair once every 2 weeks and have access to an epinephrine auto-injector set ?If your symptoms re-occur, begin a journal of events that occurred for up to 6 hours before your symptoms began including foods and beverages consumed, soaps or perfumes you had contact with, and medications.  ? ?Reflux ?Continue dietary and lifestyle modifications as listed below ?Continue  omeprazole 40 mg twice a day as previously prescribed by your GI specialist ? ?Food allergy ?Continue to avoid beans, peanuts, onion, and red dye. In case of an allergic reaction, take Benadryl 50 mg every 4 hours, and if life-threatening symptoms occur, inject with EpiPen 0.3 mg. ? ?Call the clinic if this treatment plan is not working well for you ? ?Follow up in 1 month or sooner if needed. ? ?

## 2021-10-31 NOTE — Telephone Encounter (Signed)
Perfect. So she can continue Perforomist BID? Thank you

## 2021-10-31 NOTE — Telephone Encounter (Signed)
Would arformoterol be ok to send in as an alternative to the formoterol?  ?

## 2021-10-31 NOTE — Telephone Encounter (Signed)
Oh ok. Did her insurance turn down the formoterol? If formoterol is not available she can switch to arformoterol BID please. Please call this patient and let her know of the change. Please have her call the clinic with any changes in her breathing. Thank you

## 2021-11-01 NOTE — Addendum Note (Signed)
Addended by: Vincent Peyer A on: 11/01/2021 10:08 AM ? ? Modules accepted: Orders ? ?

## 2021-11-01 NOTE — Telephone Encounter (Signed)
Working on [employer] for [employer]

## 2021-11-01 NOTE — Addendum Note (Signed)
Addended by: Bryson Corona on: 11/01/2021 10:13 AM ? ? Modules accepted: Orders ? ?

## 2021-11-14 ENCOUNTER — Ambulatory Visit (INDEPENDENT_AMBULATORY_CARE_PROVIDER_SITE_OTHER): Payer: Medicare Other

## 2021-11-14 DIAGNOSIS — L501 Idiopathic urticaria: Secondary | ICD-10-CM | POA: Diagnosis not present

## 2021-11-16 NOTE — Telephone Encounter (Signed)
Patient calling to let us know that when she did the first nasal rinse with the budesonide, but she did not put the salt mix in with it she did fine it's when she did her second nasal rinse about 4-5 days ago the nasal rinse burnt her nasal passage and throat. She now has blisters in the back of her throat and nasal passage. She has no taste or smell now. She was covid tested and it was negative. She went to see her PCP yesterday Dr. Ward Givens and was given prednisone. Patient hasn't picked it up yet. Please advise? ?

## 2021-11-16 NOTE — Telephone Encounter (Signed)
Can you please have her come in tomorrow so we can check her out and change her treatment plan to fit her needs. Thank you

## 2021-11-17 ENCOUNTER — Ambulatory Visit: Payer: Medicare Other | Admitting: Family Medicine

## 2021-11-17 ENCOUNTER — Encounter: Payer: Self-pay | Admitting: Family Medicine

## 2021-11-17 VITALS — BP 142/88 | HR 87 | Temp 98.3°F | Resp 17

## 2021-11-17 DIAGNOSIS — J310 Chronic rhinitis: Secondary | ICD-10-CM

## 2021-11-17 NOTE — Progress Notes (Signed)
? ?[address] ?[place] [id number] ?Dept: [phone] ? ?FOLLOW UP NOTE ? ?Patient ID: Katie PraderVeronica Gates, female    DOB: [birth year]  Age: 56 y.o. MRN: [id number] ?Date of Office Visit: 11/17/2021 ? ?Assessment  ?Chief Complaint: Asthma and Follow-up (Pt states that she is statrting to feel better, but her throat still hurts and she can only tolerate soft foods right now, she has an appt with her ENT in few weeks.) ? ?HPI ?Katie Gates is a 56 year old female who presents the clinic for follow-up visit.  She was last seen in this clinic on 10/31/2021 by Thermon LeylandAnne Vernecia Umble, FNP, for evaluation of asthma, allergic rhinitis, allergic conjunctivitis, chronic urticaria, reflux, tracheostomy, and multiple food allergies. At today's visit, she reports that after the second use of budesonide/saline nasal rinse she began to experience a burning sensation in both nares and in her throat. She reports that she mixed the budesonide and saline rinse exactly as directed and shook the bottle well before application. She immediately went to her primary care provider and received a burst of prednisone as well as viscous lidocaine 2% mouthwash. At today's visit, she reports her symptoms are still present, however, are beginning to slightly improve. She agrees to stop any nasal applications at this time. She will keep her appointment as previously scheduled for further evaluation. She will call the clinic with any worsening of symptoms or if her symptoms do not improve.  ? ? ?Drug Allergies:  ?Allergies  ?Allergen Reactions  ? Other Itching and Hives  ?  Avoids corn kernels alone, can have corn as an ingredient. MM, RD 08/15/16  ? Peanut-Containing Drug Products Anaphylaxis  ? Red Dye Hives  ? Bean Pod Extract Hives  ?  Avoids kidney beans  ? Fluticasone Other (See Comments)  ?  NOSE BLEEDS  ? Garlic Other (See Comments)  ?  STARTS ASTHMA ATTACKS  ? Hydroxyzine Itching  ?  Can take Benadryl and other antihistamines fine  ? Keflex  [Cephalexin]   ? Onion Other (See Comments)  ?  RAW ONION=Headache  ? Robitussin (Alcohol Free) [Guaifenesin]   ? Tomato Itching  ? ? ?Physical Exam: ?BP (!) 142/88   Pulse 87   Temp 98.3 ?F (36.8 ?C) (Temporal)   Resp 17   SpO2 98%   ? ?Physical Exam ?Vitals reviewed.  ?Constitutional:   ?   Appearance: Normal appearance.  ?HENT:  ?   Head: Normocephalic and atraumatic.  ?   Nose:  ?   Comments: Bilateral nares remain edematous. No bleeding or discharge noted. Pharynx edematous with a few scattered shallow ulcers. No blisters noted. No swelling of pharynx ?Pulmonary:  ?   Effort: Pulmonary effort is normal.  ?   Breath sounds: Normal breath sounds.  ?   Comments: Lungs clear to auscultation ?Neurological:  ?   Mental Status: She is alert.  ? ?  ? ?Assessment and Plan: ?No diagnosis found. ? ?No orders of the defined types were placed in this encounter. ? ? ?Patient Instructions  ?Asthma  ?Begin budesonide 0.5 mg twice a day via nebulizer to prevent cough or wheeze ?Begin Perforomist 20 mcg twice a day via nebulizer to prevent cough or wheeze ?Begin Yupelri 175 mcg once a day via nebulizer to prevent cough or wheeze ?The budesonide, Perforomist, and Yupelri will replace Symbicort and Spiriva. In the meantime, until you get the nebulized medications, continue Symbicort 160-2 puffs twice a day and Incruse Ellipta one puff once a day ?Continue  montelukast 10 mg once a day to prevent cough or wheeze ?Continue albuterol 2 puffs every 4 hours as needed for cough or wheeze OR Instead use albuterol 0.083% solution via nebulizer one unit vial every 4 hours as needed for cough or wheeze ?Continue to follow-up with your pulmonologist, Dr. Su Monks as recommended ? ?Rhinitis ?Continue levocetirizine 5 mg once a day as needed for a runny nose ?Begin budesonide solution one vial added to a nasal saline rinse twice a day for nasal congestion for one week, then decrease to once a day  Stop this medication if you experience any  nosebleeds ?Continue azelastine 2 sprays in each nostril twice a day as needed for a runny nose or drainage.  ?Continue saline nasal rinses as needed for nasal symptoms. Use this before any medicated nasal sprays for best result ?For dry nostrils, begin nasal saline gel as needed ? ?Allergic conjunctivitis ?Begin Pataday eye drop one drop in each eye once a day as needed for red, itchy eyes ?Continue your dry eye drop from your eye doctor ? ?Urticaria ?Continue hydroxyzine 25 mg once a day as needed for itch ?Continue Xolair once every 2 weeks and have access to an epinephrine auto-injector set ?If your symptoms re-occur, begin a journal of events that occurred for up to 6 hours before your symptoms began including foods and beverages consumed, soaps or perfumes you had contact with, and medications.  ? ?Reflux ?Continue dietary and lifestyle modifications as listed below ?Continue omeprazole 40 mg twice a day as previously prescribed by your GI specialist ? ?Food allergy ?Continue to avoid beans, peanuts, onion, and red dye. In case of an allergic reaction, take Benadryl 50 mg every 4 hours, and if life-threatening symptoms occur, inject with EpiPen 0.3 mg. ? ?Call the clinic if this treatment plan is not working well for you ? ?Follow up in 1 month or sooner if needed. ? ? ?Return in about 4 weeks (around 12/15/2021), or if symptoms worsen or fail to improve. ?  ? ?Thank you for the opportunity to care for this patient.  Please do not hesitate to contact me with questions. ? ?Thermon Leyland, FNP ?[facility] ? ? ? ? ? ?

## 2021-11-17 NOTE — Patient Instructions (Signed)
Asthma  ?Begin budesonide 0.5 mg twice a day via nebulizer to prevent cough or wheeze ?Begin Perforomist 20 mcg twice a day via nebulizer to prevent cough or wheeze ?Begin Yupelri 175 mcg once a day via nebulizer to prevent cough or wheeze ?The budesonide, Perforomist, and Yupelri will replace Symbicort and Spiriva. In the meantime, until you get the nebulized medications, continue Symbicort 160-2 puffs twice a day and Incruse Ellipta one puff once a day ?Continue montelukast 10 mg once a day to prevent cough or wheeze ?Continue albuterol 2 puffs every 4 hours as needed for cough or wheeze OR Instead use albuterol 0.083% solution via nebulizer one unit vial every 4 hours as needed for cough or wheeze ?Continue to follow-up with your pulmonologist, Dr. Su Monks as recommended ? ?Rhinitis ?Continue levocetirizine 5 mg once a day as needed for a runny nose ?Begin budesonide solution one vial added to a nasal saline rinse twice a day for nasal congestion for one week, then decrease to once a day  Stop this medication if you experience any nosebleeds ?Continue azelastine 2 sprays in each nostril twice a day as needed for a runny nose or drainage.  ?Continue saline nasal rinses as needed for nasal symptoms. Use this before any medicated nasal sprays for best result ?For dry nostrils, begin nasal saline gel as needed ? ?Allergic conjunctivitis ?Begin Pataday eye drop one drop in each eye once a day as needed for red, itchy eyes ?Continue your dry eye drop from your eye doctor ? ?Urticaria ?Continue hydroxyzine 25 mg once a day as needed for itch ?Continue Xolair once every 2 weeks and have access to an epinephrine auto-injector set ?If your symptoms re-occur, begin a journal of events that occurred for up to 6 hours before your symptoms began including foods and beverages consumed, soaps or perfumes you had contact with, and medications.  ? ?Reflux ?Continue dietary and lifestyle modifications as listed below ?Continue  omeprazole 40 mg twice a day as previously prescribed by your GI specialist ? ?Food allergy ?Continue to avoid beans, peanuts, onion, and red dye. In case of an allergic reaction, take Benadryl 50 mg every 4 hours, and if life-threatening symptoms occur, inject with EpiPen 0.3 mg. ? ?Call the clinic if this treatment plan is not working well for you ? ?Follow up in 1 month or sooner if needed. ? ?

## 2021-11-19 ENCOUNTER — Encounter: Payer: Self-pay | Admitting: Family Medicine

## 2021-11-28 ENCOUNTER — Ambulatory Visit (INDEPENDENT_AMBULATORY_CARE_PROVIDER_SITE_OTHER): Payer: Medicare Other

## 2021-11-28 DIAGNOSIS — L501 Idiopathic urticaria: Secondary | ICD-10-CM

## 2021-12-03 ENCOUNTER — Other Ambulatory Visit: Payer: Self-pay | Admitting: Family Medicine

## 2021-12-07 ENCOUNTER — Telehealth: Payer: Self-pay

## 2021-12-07 NOTE — Telephone Encounter (Signed)
Spoke to patient and let her know that part B covered the performist but the cost would be 82.52 a month oppose to brovana 150 mcg/75ml would be for free is it ok to continue her on the brovana? ?

## 2021-12-08 MED ORDER — ARFORMOTEROL TARTRATE 15 MCG/2ML IN NEBU: 15.0000 ug | INHALATION_SOLUTION | Freq: Every day | RESPIRATORY_TRACT | 1 refills | Status: DC

## 2021-12-08 NOTE — Telephone Encounter (Signed)
Sent in brovanna and informed pt of me doing so ?

## 2021-12-08 NOTE — Telephone Encounter (Signed)
OK to change.  Thank you

## 2021-12-10 ENCOUNTER — Other Ambulatory Visit: Payer: Self-pay | Admitting: Family Medicine

## 2021-12-12 ENCOUNTER — Ambulatory Visit (INDEPENDENT_AMBULATORY_CARE_PROVIDER_SITE_OTHER): Payer: Medicare Other

## 2021-12-12 DIAGNOSIS — L501 Idiopathic urticaria: Secondary | ICD-10-CM

## 2021-12-14 ENCOUNTER — Other Ambulatory Visit: Payer: Self-pay | Admitting: Family Medicine

## 2021-12-26 ENCOUNTER — Ambulatory Visit (INDEPENDENT_AMBULATORY_CARE_PROVIDER_SITE_OTHER): Payer: Medicare Other

## 2021-12-26 DIAGNOSIS — L501 Idiopathic urticaria: Secondary | ICD-10-CM

## 2021-12-29 ENCOUNTER — Other Ambulatory Visit: Payer: Self-pay | Admitting: *Deleted

## 2022-01-09 ENCOUNTER — Other Ambulatory Visit: Payer: Self-pay | Admitting: Family Medicine

## 2022-01-09 ENCOUNTER — Ambulatory Visit (INDEPENDENT_AMBULATORY_CARE_PROVIDER_SITE_OTHER): Payer: Medicare Other

## 2022-01-09 DIAGNOSIS — L501 Idiopathic urticaria: Secondary | ICD-10-CM | POA: Diagnosis not present

## 2022-01-09 NOTE — Telephone Encounter (Signed)
Please advise 

## 2022-01-11 NOTE — Telephone Encounter (Signed)
Can you please reorder this medication with 3 refills? Thank you

## 2022-01-23 ENCOUNTER — Ambulatory Visit (INDEPENDENT_AMBULATORY_CARE_PROVIDER_SITE_OTHER): Payer: Medicare Other

## 2022-01-23 DIAGNOSIS — L501 Idiopathic urticaria: Secondary | ICD-10-CM | POA: Diagnosis not present

## 2022-01-27 ENCOUNTER — Other Ambulatory Visit: Payer: Self-pay | Admitting: Family Medicine

## 2022-02-08 ENCOUNTER — Ambulatory Visit (INDEPENDENT_AMBULATORY_CARE_PROVIDER_SITE_OTHER): Payer: Medicare Other

## 2022-02-08 DIAGNOSIS — L501 Idiopathic urticaria: Secondary | ICD-10-CM

## 2022-02-12 ENCOUNTER — Other Ambulatory Visit: Payer: Self-pay | Admitting: Family Medicine

## 2022-02-12 NOTE — Telephone Encounter (Signed)
Please advise 

## 2022-02-14 NOTE — Telephone Encounter (Signed)
Refuse please. She is not using this one currently. Thank you

## 2022-02-22 ENCOUNTER — Ambulatory Visit: Payer: Medicare Other

## 2022-02-23 ENCOUNTER — Ambulatory Visit (INDEPENDENT_AMBULATORY_CARE_PROVIDER_SITE_OTHER): Payer: Medicare Other | Admitting: *Deleted

## 2022-02-23 DIAGNOSIS — L501 Idiopathic urticaria: Secondary | ICD-10-CM

## 2022-03-09 ENCOUNTER — Ambulatory Visit (INDEPENDENT_AMBULATORY_CARE_PROVIDER_SITE_OTHER): Payer: Medicare Other | Admitting: *Deleted

## 2022-03-09 DIAGNOSIS — L501 Idiopathic urticaria: Secondary | ICD-10-CM | POA: Diagnosis not present

## 2022-03-23 ENCOUNTER — Ambulatory Visit (INDEPENDENT_AMBULATORY_CARE_PROVIDER_SITE_OTHER): Payer: Medicare Other | Admitting: *Deleted

## 2022-03-23 DIAGNOSIS — L501 Idiopathic urticaria: Secondary | ICD-10-CM

## 2022-04-06 ENCOUNTER — Ambulatory Visit: Payer: Medicare Other

## 2022-04-10 ENCOUNTER — Other Ambulatory Visit: Payer: Self-pay | Admitting: Family Medicine

## 2022-04-12 ENCOUNTER — Ambulatory Visit (INDEPENDENT_AMBULATORY_CARE_PROVIDER_SITE_OTHER): Payer: Medicare Other

## 2022-04-12 DIAGNOSIS — L501 Idiopathic urticaria: Secondary | ICD-10-CM | POA: Diagnosis not present

## 2022-04-26 ENCOUNTER — Ambulatory Visit (INDEPENDENT_AMBULATORY_CARE_PROVIDER_SITE_OTHER): Payer: Medicare Other

## 2022-04-26 DIAGNOSIS — L501 Idiopathic urticaria: Secondary | ICD-10-CM

## 2022-04-27 ENCOUNTER — Other Ambulatory Visit: Payer: Self-pay | Admitting: Family Medicine

## 2022-05-01 ENCOUNTER — Other Ambulatory Visit: Payer: Self-pay | Admitting: Allergy & Immunology

## 2022-05-03 ENCOUNTER — Other Ambulatory Visit: Payer: Self-pay | Admitting: Family Medicine

## 2022-05-09 ENCOUNTER — Other Ambulatory Visit (HOSPITAL_COMMUNITY): Payer: Self-pay

## 2022-05-11 ENCOUNTER — Ambulatory Visit (INDEPENDENT_AMBULATORY_CARE_PROVIDER_SITE_OTHER): Payer: Medicare Other

## 2022-05-11 DIAGNOSIS — L501 Idiopathic urticaria: Secondary | ICD-10-CM | POA: Diagnosis not present

## 2022-05-28 ENCOUNTER — Other Ambulatory Visit: Payer: Self-pay | Admitting: Family Medicine

## 2022-05-28 ENCOUNTER — Ambulatory Visit (INDEPENDENT_AMBULATORY_CARE_PROVIDER_SITE_OTHER): Payer: Medicare Other

## 2022-05-28 DIAGNOSIS — L501 Idiopathic urticaria: Secondary | ICD-10-CM | POA: Diagnosis not present

## 2022-06-11 ENCOUNTER — Ambulatory Visit (INDEPENDENT_AMBULATORY_CARE_PROVIDER_SITE_OTHER): Payer: Medicare Other

## 2022-06-11 DIAGNOSIS — L501 Idiopathic urticaria: Secondary | ICD-10-CM

## 2022-06-25 ENCOUNTER — Ambulatory Visit (INDEPENDENT_AMBULATORY_CARE_PROVIDER_SITE_OTHER): Payer: Medicare Other

## 2022-06-25 DIAGNOSIS — L501 Idiopathic urticaria: Secondary | ICD-10-CM | POA: Diagnosis not present

## 2022-06-26 ENCOUNTER — Other Ambulatory Visit: Payer: Self-pay | Admitting: Family Medicine

## 2022-07-09 ENCOUNTER — Ambulatory Visit (INDEPENDENT_AMBULATORY_CARE_PROVIDER_SITE_OTHER): Payer: Medicare Other

## 2022-07-09 DIAGNOSIS — L501 Idiopathic urticaria: Secondary | ICD-10-CM | POA: Diagnosis not present

## 2022-07-23 ENCOUNTER — Ambulatory Visit (INDEPENDENT_AMBULATORY_CARE_PROVIDER_SITE_OTHER): Payer: Medicare Other | Admitting: *Deleted

## 2022-07-23 DIAGNOSIS — J4540 Moderate persistent asthma, uncomplicated: Secondary | ICD-10-CM

## 2022-07-23 DIAGNOSIS — L501 Idiopathic urticaria: Secondary | ICD-10-CM

## 2022-07-26 ENCOUNTER — Other Ambulatory Visit: Payer: Self-pay | Admitting: Family Medicine

## 2022-08-08 ENCOUNTER — Ambulatory Visit (INDEPENDENT_AMBULATORY_CARE_PROVIDER_SITE_OTHER): Payer: Medicare Other

## 2022-08-08 DIAGNOSIS — J4540 Moderate persistent asthma, uncomplicated: Secondary | ICD-10-CM | POA: Diagnosis not present

## 2022-08-22 ENCOUNTER — Ambulatory Visit (INDEPENDENT_AMBULATORY_CARE_PROVIDER_SITE_OTHER): Payer: Medicare Other

## 2022-08-22 DIAGNOSIS — J309 Allergic rhinitis, unspecified: Secondary | ICD-10-CM

## 2022-08-22 DIAGNOSIS — L501 Idiopathic urticaria: Secondary | ICD-10-CM | POA: Diagnosis not present

## 2022-09-04 ENCOUNTER — Ambulatory Visit (INDEPENDENT_AMBULATORY_CARE_PROVIDER_SITE_OTHER): Payer: Medicare Other

## 2022-09-04 DIAGNOSIS — J4540 Moderate persistent asthma, uncomplicated: Secondary | ICD-10-CM | POA: Diagnosis not present

## 2022-09-11 ENCOUNTER — Other Ambulatory Visit: Payer: Self-pay

## 2022-09-11 ENCOUNTER — Encounter: Payer: Self-pay | Admitting: Family Medicine

## 2022-09-11 ENCOUNTER — Ambulatory Visit (INDEPENDENT_AMBULATORY_CARE_PROVIDER_SITE_OTHER): Payer: Medicare Other | Admitting: Family Medicine

## 2022-09-11 VITALS — BP 122/76 | HR 104 | Temp 98.7°F | Resp 18

## 2022-09-11 DIAGNOSIS — K219 Gastro-esophageal reflux disease without esophagitis: Secondary | ICD-10-CM

## 2022-09-11 DIAGNOSIS — H1013 Acute atopic conjunctivitis, bilateral: Secondary | ICD-10-CM | POA: Diagnosis not present

## 2022-09-11 DIAGNOSIS — L501 Idiopathic urticaria: Secondary | ICD-10-CM

## 2022-09-11 DIAGNOSIS — J4550 Severe persistent asthma, uncomplicated: Secondary | ICD-10-CM

## 2022-09-11 DIAGNOSIS — T781XXD Other adverse food reactions, not elsewhere classified, subsequent encounter: Secondary | ICD-10-CM

## 2022-09-11 DIAGNOSIS — J310 Chronic rhinitis: Secondary | ICD-10-CM | POA: Diagnosis not present

## 2022-09-11 MED ORDER — ALBUTEROL SULFATE (2.5 MG/3ML) 0.083% IN NEBU: INHALATION_SOLUTION | RESPIRATORY_TRACT | 2 refills | Status: DC

## 2022-09-11 MED ORDER — EPINEPHRINE 0.3 MG/0.3ML IJ SOAJ: 0.3000 mg | INTRAMUSCULAR | 2 refills | Status: DC | PRN

## 2022-09-11 NOTE — Progress Notes (Addendum)
[place] [address] Dept: [phone]  FOLLOW UP NOTE  Patient ID: Katie Gates, female    DOB: 12-10-1965  Age: 57 y.o. MRN: [id number] Date of Office Visit: 09/11/2022  Assessment  Chief Complaint: Follow-up (Doing well no refills needed, slight cough has trach)  HPI Katie Gates is a 57 year old female who presents to the clinic for follow-up visit.  She was last seen in this clinic on 11/17/2021 by Gareth Morgan, FNP, for evaluation of asthma, rhinitis, allergic conjunctivitis, urticaria, reflux, and food allergy to beans, peanut, onion, and red dye.  At today's visit, she reports her asthma has been moderately well-controlled with symptoms including occasional shortness of breath especially while coughing, wheeze that occurs mostly in the winter, and cough producing mucus occurring mainly when she goes outside.  She reports her cough and asthma symptoms are aggravated by pungent odors, cold weather, warm weather, and any kind of powder residue in the air.  She continues budesonide 0.5 mg twice a day by and the lesser, Perforomist 20 mcg twice a day via nebulizer, and Yupelri 175 mcg once a day via nebulizer.  She continues albuterol as needed which is about 2-3 times a week with relief of symptoms.  She continues Xolair injections 300 mg once every 2 weeks with no large or local reactions.  She continues to follow-up with her pulmonology specialist at [facility].  Allergic rhinitis is reported as moderately well-controlled with symptoms including nasal congestion, and occasional sneezing which occurs through her trach producing thick clear drainage.  She continues levocetirizine 5 mg once a day and occasionally uses nasal saline rinses.  She has previously tried nasal steroid spray as well as budesonide nasal rinses which she reports burned her nose.  Her last environmental allergy testing was in 2018 and was negative to the environmental  panel via lab.  Chronic urticaria is reported as well-controlled with no hives since her last visit to this clinic.  She continues levocetirizine 5 mg once a day and continues to receive Xolair injections 300 mg once every 2 weeks with relief of symptoms.    Reflux is reported as well-controlled with the exception of food driven symptoms.  She continues omeprazole as previously prescribed by her GI specialist.  She continues to avoid beans, peanuts, onion, red dye with no accidental ingestion or EpiPen use since her last visit to this clinic.  Her EpiPen's are out of date.  Her current medications are listed in the chart.  Drug Allergies:  Allergies  Allergen Reactions   Other Itching and Hives    Avoids corn kernels alone, can have corn as an ingredient. MM, RD 08/15/16   Peanut-Containing Drug Products Anaphylaxis   Red Dye Hives   Bean Pod Extract Hives    Avoids kidney beans   Fluticasone Other (See Comments)    NOSE BLEEDS   Garlic Other (See Comments)    STARTS ASTHMA ATTACKS   Hydroxyzine Itching    Can take Benadryl and other antihistamines fine   Keflex [Cephalexin]    Onion Other (See Comments)    RAW ONION=Headache   Robitussin (Alcohol Free) [Guaifenesin]    Tomato Itching    Physical Exam: BP 122/76 (BP Location: Left Arm, Patient Position: Sitting, Cuff Size: Normal)   Pulse (!) 104   Temp 98.7 F (37.1 C) (Temporal)   Resp 18   SpO2 99%    Physical Exam Vitals reviewed.  Constitutional:  Appearance: Normal appearance.  HENT:     Head: Normocephalic and atraumatic.     Right Ear: Tympanic membrane normal.     Left Ear: Tympanic membrane normal.     Nose:     Comments: Bilateral nares grossly edematous and pale with clear thin nasal drainage noted.  Pharynx normal.  Lurline Idol remains in place and patent.  Ears normal.  Eyes normal.    Mouth/Throat:     Pharynx: Oropharynx is clear.  Eyes:     Conjunctiva/sclera: Conjunctivae normal.  Cardiovascular:      Rate and Rhythm: Normal rate and regular rhythm.     Heart sounds: Normal heart sounds. No murmur heard. Pulmonary:     Effort: Pulmonary effort is normal.     Breath sounds: Normal breath sounds.     Comments: Lungs clear to auscultation Musculoskeletal:        General: Normal range of motion.     Cervical back: Normal range of motion and neck supple.  Skin:    General: Skin is warm and dry.  Neurological:     Mental Status: She is alert and oriented to person, place, and time.  Psychiatric:        Mood and Affect: Mood normal.        Behavior: Behavior normal.        Thought Content: Thought content normal.        Judgment: Judgment normal.     Assessment and Plan: 1. Severe persistent asthma without complication   2. Chronic rhinitis   3. Allergic conjunctivitis of both eyes   4. Idiopathic urticaria   5. Multiple food allergies   6. Gastroesophageal reflux disease, unspecified whether esophagitis present     Meds ordered this encounter  Medications   albuterol (PROVENTIL) (2.5 MG/3ML) 0.083% nebulizer solution    Sig: INHALE 1 VIAL VIA NEBULIZER EVERY 6 (SIX) HOURS AS NEEDED FOR WHEEZING OR SHORTNESS OF BREATH.    Dispense:  75 mL    Refill:  2   EPINEPHrine 0.3 mg/0.3 mL IJ SOAJ injection    Sig: Inject 0.3 mg into the muscle as needed for anaphylaxis.    Dispense:  2 each    Refill:  2    Patient Instructions  Asthma  Continue budesonide 0.5 mg twice a day via nebulizer to prevent cough or wheeze Continue Perforomist 20 mcg twice a day via nebulizer to prevent cough or wheeze Continue Yupelri 175 mcg once a day via nebulizer to prevent cough or wheeze Continue montelukast 10 mg once a day to prevent cough or wheeze Continue albuterol 2 puffs every 4 hours as needed for cough or wheeze OR Instead use albuterol 0.083% solution via nebulizer one unit vial every 4 hours as needed for cough or wheeze Continue to follow-up with your pulmonologist, Dr. Camillo Flaming as  recommended  Rhinitis Begin Flonase 2 sprays in each nostril once a day for nasal congestion.  In the right nostril, point the applicator out toward the right ear. In the left nostril, point the applicator out toward the left ear Continue levocetirizine 5 mg once a day as needed for a runny nose Continue azelastine 2 sprays in each nostril twice a day as needed for a runny nose or drainage.  Continue saline nasal rinses at least once a day and as needed for nasal symptoms. Use this before any medicated nasal sprays for best result For dry nostrils, begin nasal saline gel as needed Return to the clinic to update  your environmental allergy skin testing.  Remember to stop your antihistamine (levocetirizine and hydroxyzine for) for 3 days before your skin testing appointment  Allergic conjunctivitis Begin Pataday eye drop one drop in each eye once a day as needed for red, itchy eyes Continue your dry eye drop from your eye doctor  Urticaria Continue hydroxyzine 25 mg once a day as needed for itch Continue Xolair once every 2 weeks and have access to an epinephrine auto-injector set If your symptoms re-occur, begin a journal of events that occurred for up to 6 hours before your symptoms began including foods and beverages consumed, soaps or perfumes you had contact with, and medications.   Reflux Continue dietary and lifestyle modifications as listed below Continue omeprazole 40 mg twice a day as previously prescribed by your GI specialist  Food allergy Continue to avoid beans, peanuts, onion, and red dye. In case of an allergic reaction, take Benadryl 50 mg every 4 hours, and if life-threatening symptoms occur, inject with EpiPen 0.3 mg.  Call the clinic if this treatment plan is not working well for you  Follow up in 1 month or sooner if needed.  Return in about 4 weeks (around 10/09/2022), or if symptoms worsen or fail to improve.    Thank you for the opportunity to care for this  patient.  Please do not hesitate to contact me with questions.  Gareth Morgan, FNP [facility] [place]

## 2022-09-11 NOTE — Patient Instructions (Addendum)
Asthma  Continue budesonide 0.5 mg twice a day via nebulizer to prevent cough or wheeze Continue Perforomist 20 mcg twice a day via nebulizer to prevent cough or wheeze Continue Yupelri 175 mcg once a day via nebulizer to prevent cough or wheeze Continue montelukast 10 mg once a day to prevent cough or wheeze Continue albuterol 2 puffs every 4 hours as needed for cough or wheeze OR Instead use albuterol 0.083% solution via nebulizer one unit vial every 4 hours as needed for cough or wheeze Continue to follow-up with your pulmonologist, Dr. Camillo Flaming as recommended  Rhinitis Begin Flonase 2 sprays in each nostril once a day for nasal congestion.  In the right nostril, point the applicator out toward the right ear. In the left nostril, point the applicator out toward the left ear Continue levocetirizine 5 mg once a day as needed for a runny nose Continue azelastine 2 sprays in each nostril twice a day as needed for a runny nose or drainage.  Continue saline nasal rinses at least once a day and as needed for nasal symptoms. Use this before any medicated nasal sprays for best result For dry nostrils, begin nasal saline gel as needed Return to the clinic to update your environmental allergy skin testing.  Remember to stop your antihistamine (levocetirizine and hydroxyzine for) for 3 days before your skin testing appointment  Allergic conjunctivitis Begin Pataday eye drop one drop in each eye once a day as needed for red, itchy eyes Continue your dry eye drop from your eye doctor  Urticaria Continue hydroxyzine 25 mg once a day as needed for itch Continue Xolair once every 2 weeks and have access to an epinephrine auto-injector set If your symptoms re-occur, begin a journal of events that occurred for up to 6 hours before your symptoms began including foods and beverages consumed, soaps or perfumes you had contact with, and medications.   Reflux Continue dietary and lifestyle modifications as listed  below Continue omeprazole 40 mg twice a day as previously prescribed by your GI specialist  Food allergy Continue to avoid beans, peanuts, onion, and red dye. In case of an allergic reaction, take Benadryl 50 mg every 4 hours, and if life-threatening symptoms occur, inject with EpiPen 0.3 mg.  Call the clinic if this treatment plan is not working well for you  Follow up in 1 month or sooner if needed.

## 2022-09-18 ENCOUNTER — Ambulatory Visit (INDEPENDENT_AMBULATORY_CARE_PROVIDER_SITE_OTHER): Payer: Medicare Other

## 2022-09-18 DIAGNOSIS — L500 Allergic urticaria: Secondary | ICD-10-CM

## 2022-09-27 ENCOUNTER — Other Ambulatory Visit: Payer: Self-pay | Admitting: Family Medicine

## 2022-10-02 ENCOUNTER — Ambulatory Visit (INDEPENDENT_AMBULATORY_CARE_PROVIDER_SITE_OTHER): Payer: Medicare Other

## 2022-10-02 DIAGNOSIS — L500 Allergic urticaria: Secondary | ICD-10-CM | POA: Diagnosis not present

## 2022-10-15 NOTE — Patient Instructions (Incomplete)
Asthma  Depo-Medrol 80 IM given in the clinic Continue budesonide 0.5 mg twice a day via nebulizer to prevent cough or wheeze Continue Perforomist 20 mcg twice a day via nebulizer to prevent cough or wheeze Continue Yupelri 175 mcg once a day via nebulizer to prevent cough or wheeze Continue montelukast 10 mg once a day to prevent cough or wheeze Continue albuterol 2 puffs every 4 hours as needed for cough or wheeze OR Instead use albuterol 0.083% solution via nebulizer one unit vial every 4 hours as needed for cough or wheeze Continue to follow-up with your pulmonologist, Dr. Camillo Flaming as recommended  Rhinitis Begin Flonase 2 sprays in each nostril once a day for nasal congestion.  In the right nostril, point the applicator out toward the right ear. In the left nostril, point the applicator out toward the left ear Continue levocetirizine 5 mg once a day as needed for a runny nose Continue azelastine 2 sprays in each nostril twice a day as needed for a runny nose or drainage.  Continue saline nasal rinses at least once a day and as needed for nasal symptoms. Use this before any medicated nasal sprays for best result For dry nostrils, begin nasal saline gel as needed  Allergic conjunctivitis Begin Pataday eye drop one drop in each eye once a day as needed for red, itchy eyes Continue your dry eye drop from your eye doctor  Urticaria Continue hydroxyzine 25 mg once a day as needed for itch Continue Xolair once every 2 weeks and have access to an epinephrine auto-injector set If your symptoms re-occur, begin a journal of events that occurred for up to 6 hours before your symptoms began including foods and beverages consumed, soaps or perfumes you had contact with, and medications.   Reflux Continue dietary and lifestyle modifications as listed below Continue omeprazole 40 mg twice a day as previously prescribed by your GI specialist  Food allergy Continue to avoid beans, peanuts, onion, and red  dye. In case of an allergic reaction, take Benadryl 50 mg every 4 hours, and if life-threatening symptoms occur, inject with EpiPen 0.3 mg.  Call the clinic if this treatment plan is not working well for you  Follow up in 1 month or sooner if needed.

## 2022-10-15 NOTE — Progress Notes (Signed)
   [place] [address] Dept: [phone]  FOLLOW UP NOTE  Patient ID: Katie Gates, female    DOB: 1965-11-27  Age: 57 y.o. MRN: [id number] Date of Office Visit: 10/16/2022  Assessment  Chief Complaint: No chief complaint on file.  HPI Katie Gates is a 57 year old female who presents to the clinic for follow-up visit.  She was last seen in this clinic on 09/11/2022 by Gareth Morgan, FNP, for evaluation of asthma, allergic rhinitis, allergic conjunctivitis, urticaria, reflux, and food allergy to bean, peanut, onion, and red dye.   Drug Allergies:  Allergies  Allergen Reactions   Other Itching and Hives    Avoids corn kernels alone, can have corn as an ingredient. MM, RD 08/15/16   Peanut-Containing Drug Products Anaphylaxis   Red Dye Hives   Bean Pod Extract Hives    Avoids kidney beans   Fluticasone Other (See Comments)    NOSE BLEEDS   Garlic Other (See Comments)    STARTS ASTHMA ATTACKS   Hydroxyzine Itching    Can take Benadryl and other antihistamines fine   Keflex [Cephalexin]    Onion Other (See Comments)    RAW ONION=Headache   Robitussin (Alcohol Free) [Guaifenesin]    Tomato Itching    Physical Exam: There were no vitals taken for this visit.   Physical Exam  Diagnostics:    Assessment and Plan: No diagnosis found.  No orders of the defined types were placed in this encounter.   There are no Patient Instructions on file for this visit.  No follow-ups on file.    Thank you for the opportunity to care for this patient.  Please do not hesitate to contact me with questions.  Gareth Morgan, FNP [facility] [place]

## 2022-10-16 ENCOUNTER — Ambulatory Visit (INDEPENDENT_AMBULATORY_CARE_PROVIDER_SITE_OTHER): Payer: Medicare Other | Admitting: Family Medicine

## 2022-10-16 ENCOUNTER — Other Ambulatory Visit: Payer: Self-pay

## 2022-10-16 ENCOUNTER — Encounter: Payer: Self-pay | Admitting: Family Medicine

## 2022-10-16 VITALS — BP 130/86 | HR 85 | Temp 98.0°F | Resp 18 | Wt 186.4 lb

## 2022-10-16 DIAGNOSIS — K219 Gastro-esophageal reflux disease without esophagitis: Secondary | ICD-10-CM

## 2022-10-16 DIAGNOSIS — J4551 Severe persistent asthma with (acute) exacerbation: Secondary | ICD-10-CM | POA: Diagnosis not present

## 2022-10-16 DIAGNOSIS — H1013 Acute atopic conjunctivitis, bilateral: Secondary | ICD-10-CM | POA: Diagnosis not present

## 2022-10-16 DIAGNOSIS — Z930 Tracheostomy status: Secondary | ICD-10-CM

## 2022-10-16 DIAGNOSIS — T781XXD Other adverse food reactions, not elsewhere classified, subsequent encounter: Secondary | ICD-10-CM

## 2022-10-16 DIAGNOSIS — J310 Chronic rhinitis: Secondary | ICD-10-CM | POA: Diagnosis not present

## 2022-10-16 DIAGNOSIS — L508 Other urticaria: Secondary | ICD-10-CM | POA: Diagnosis not present

## 2022-10-16 MED ORDER — METHYLPREDNISOLONE ACETATE 80 MG/ML IJ SUSP
80.0000 mg | Freq: Once | INTRAMUSCULAR | Status: AC
  Administered 2022-10-16: 80 mg via INTRAMUSCULAR

## 2022-10-16 MED ORDER — METHYLPREDNISOLONE ACETATE 80 MG/ML IJ SUSP: 80.0000 mg | Freq: Once | INTRAMUSCULAR | Status: DC

## 2022-10-17 ENCOUNTER — Ambulatory Visit (INDEPENDENT_AMBULATORY_CARE_PROVIDER_SITE_OTHER): Payer: Medicare Other

## 2022-10-17 DIAGNOSIS — L501 Idiopathic urticaria: Secondary | ICD-10-CM | POA: Diagnosis not present

## 2022-10-31 ENCOUNTER — Ambulatory Visit (INDEPENDENT_AMBULATORY_CARE_PROVIDER_SITE_OTHER): Payer: Medicare Other

## 2022-10-31 DIAGNOSIS — L501 Idiopathic urticaria: Secondary | ICD-10-CM | POA: Diagnosis not present

## 2022-11-13 NOTE — Progress Notes (Signed)
[address] [place] [address] Dept: [phone]  FOLLOW UP NOTE  Patient ID: Katie Gates, female    DOB: [birth year]  Age: 57 y.o. MRN: [id number] Date of Office Visit: 11/14/2022  Assessment  Chief Complaint: Follow-up  HPI Katie Gates is a 57 year old female who presents to the clinic for follow-up visit.  She was last seen in this clinic on 10/16/2022 by Thermon Leyland, FNP, for evaluation of asthma with acute exacerbation requiring Depo-Medrol in the clinic, allergic rhinitis, allergic conjunctivitis, urticaria, reflux, and food allergy to been, peanut, onion, and red dye.  At today's visit, she reports her asthma has been moderately well-controlled with occasional shortness of breath with activity and cough producing clear mucus.  She continues budesonide 0.5 mg twice a day via nebulizer, Perforomist 20 mcg twice a day via nebulizer, and Yupelri 175 mg once a day via nebulizer.  She reports using albuterol about 2 to 3 days a week with relief of symptoms.  She continues Xolair injections 300 mg once every 2 weeks with no large or local reactions.  She reports that if she continues to wear a mask over her mouth as well as a cover over her trach she has been more controlled during this pollen season.  She continues to follow-up with her pulmonology specialist at [facility] as recommended.  Allergic rhinitis is reported as moderately well-controlled with symptoms including occasional nasal congestion, clear rhinorrhea, sneezing, and frequent postnasal drainage.  She reports occasionally coughing clear mucus from her trach since her last visit to this clinic.  She continues levocetirizine 5 mg once a day and azelastine as needed.  She is not currently using nasal saline rinses or Flonase nasal spray.  She does report anosmia which has been present for the last several years.  Allergic conjunctivitis is reported as moderately well-controlled with  symptoms including occasional red and itchy eyes for which she continues olopatadine as needed with relief of symptoms.  Urticaria is reported as well-controlled with only occasional breakouts.  She reports that these breakouts occur when she is in contact with certain fabrics, red dye, beans, or onions.  Reflux is reported as well-controlled with no symptoms including heartburn or vomiting.  She continues omeprazole daily and takes this medication 30 minutes before her first meal  She continues to avoid beans, peanuts, onion, and red dye with no accidental ingestion or EpiPen use since her last visit to this clinic.  Her EpiPen's are up-to-date.  Her current medications are listed in the chart.   Drug Allergies:  Allergies  Allergen Reactions   Other Itching and Hives    Avoids corn kernels alone, can have corn as an ingredient. MM, RD 08/15/16   Peanut-Containing Drug Products Anaphylaxis   Red Dye Hives   Bean Pod Extract Hives    Avoids kidney beans   Fluticasone Other (See Comments)    NOSE BLEEDS   Garlic Other (See Comments)    STARTS ASTHMA ATTACKS   Hydroxyzine Itching    Can take Benadryl and other antihistamines fine   Keflex [Cephalexin]    Onion Other (See Comments)    RAW ONION=Headache   Robitussin (Alcohol Free) [Guaifenesin]    Tomato Itching    Physical Exam: BP 100/70 (BP Location: Right Arm, Patient Position: Sitting, Cuff Size: Normal)   Pulse 92   Temp 98 F (36.7 C) (Temporal)   Resp 18   Wt 189 lb 8 oz (86 kg)   SpO2  100%   BMI 29.68 kg/m    Physical Exam Vitals reviewed.  Constitutional:      Appearance: Normal appearance.  HENT:     Head: Normocephalic and atraumatic.     Right Ear: Tympanic membrane normal.     Left Ear: Tympanic membrane normal.     Nose:     Comments: Bilateral naris edematous and pale with thin clear nasal drainage noted.  Pharynx slightly erythematous with no exudate.  [name] remains patent.  Ears normal.  Eyes  normal.    Mouth/Throat:     Pharynx: Oropharynx is clear.  Eyes:     Conjunctiva/sclera: Conjunctivae normal.  Cardiovascular:     Rate and Rhythm: Normal rate and regular rhythm.     Heart sounds: Normal heart sounds. No murmur heard. Pulmonary:     Effort: Pulmonary effort is normal.     Breath sounds: Normal breath sounds.     Comments: Lungs clear to auscultation Musculoskeletal:        General: Normal range of motion.     Cervical back: Normal range of motion and neck supple.  Skin:    General: Skin is warm and dry.  Neurological:     Mental Status: She is alert and oriented to person, place, and time.  Psychiatric:        Mood and Affect: Mood normal.        Behavior: Behavior normal.        Thought Content: Thought content normal.        Judgment: Judgment normal.     Assessment and Plan: 1. 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 Idiopathic urticaria   4. Chronic rhinitis   5. Allergic conjunctivitis of both eyes   6. Multiple food allergies     Meds ordered this encounter  Medications   albuterol (PROVENTIL) (2.5 MG/3ML) 0.083% nebulizer solution    Sig: INHALE 1 VIAL VIA NEBULIZER EVERY 6 (SIX) HOURS AS NEEDED FOR WHEEZING OR SHORTNESS OF BREATH.    Dispense:  75 mL    Refill:  2   budesonide (PULMICORT) 0.5 MG/2ML nebulizer solution    Sig: TAKE 2 ML (0.5 MG TOTAL) BY NEBULIZATION TWICE A DAY    Dispense:  180 mL    Refill:  1   arformoterol (BROVANA) 15 MCG/2ML NEBU    Sig: Take 2 mLs (15 mcg total) by nebulization daily.    Dispense:  60 mL    Refill:  3   revefenacin (YUPELRI) 175 MCG/3ML nebulizer solution    Sig: Take 3 mLs (175 mcg total) by nebulization daily.    Dispense:  90 mL    Refill:  5    Dx Code: J45.40, Please file with Medicare part B.    Patient Instructions  Asthma  Continue budesonide 0.5 mg twice a day via nebulizer to prevent cough or wheeze Continue Perforomist 20 mcg twice a day via  nebulizer to prevent cough or wheeze Continue Yupelri 175 mcg once a day via nebulizer to prevent cough or wheeze Continue montelukast 10 mg once a day to prevent cough or wheeze Continue albuterol 2 puffs every 4 hours as needed for cough or wheeze OR Instead use albuterol 0.083% solution via nebulizer one unit vial every 4 hours as needed for cough or wheeze Continue to follow-up with your pulmonologist, Dr. Su Monks as recommended  Rhinitis Continue Flonase 2 sprays in each nostril once a day for nasal congestion.  In  the right nostril, point the applicator out toward the right ear. In the left nostril, point the applicator out toward the left ear Continue levocetirizine 5 mg once a day as needed for a runny nose Continue azelastine 2 sprays in each nostril twice a day as needed for a runny nose or drainage.  Continue saline nasal rinses at least once a day and as needed for nasal symptoms. Use this before any medicated nasal sprays for best result For dry nostrils, begin nasal saline gel as needed Refer to ENT for evaluation of anatomy and possible polyposis  Allergic conjunctivitis Continue Pataday eye drop one drop in each eye once a day as needed for red, itchy eyes Continue your dry eye drop from your eye doctor  Urticaria Continue hydroxyzine 25 mg once a day as needed for itch Continue Xolair once every 2 weeks and have access to an epinephrine auto-injector set If your symptoms re-occur, begin a journal of events that occurred for up to 6 hours before your symptoms began including foods and beverages consumed, soaps or perfumes you had contact with, and medications.   Reflux Continue dietary and lifestyle modifications as listed below Continue omeprazole 40 mg twice a day as previously prescribed by your GI specialist  Food allergy Continue to avoid beans, peanuts, onion, and red dye. In case of an allergic reaction, take Benadryl 50 mg every 4 hours, and if life-threatening  symptoms occur, inject with EpiPen 0.3 mg.  Call the clinic if this treatment plan is not working well for you  Follow up in 3 months or sooner if needed.  Return in about 3 months (around 02/13/2023), or if symptoms worsen or fail to improve.    Thank you for the opportunity to care for this patient.  Please do not hesitate to contact me with questions.  Thermon Leyland, FNP [facility]

## 2022-11-13 NOTE — Patient Instructions (Incomplete)
Asthma  Continue budesonide 0.5 mg twice a day via nebulizer to prevent cough or wheeze Continue Perforomist 20 mcg twice a day via nebulizer to prevent cough or wheeze Continue Yupelri 175 mcg once a day via nebulizer to prevent cough or wheeze Continue montelukast 10 mg once a day to prevent cough or wheeze Continue albuterol 2 puffs every 4 hours as needed for cough or wheeze OR Instead use albuterol 0.083% solution via nebulizer one unit vial every 4 hours as needed for cough or wheeze Continue to follow-up with your pulmonologist, Dr. Su Monks as recommended  Rhinitis Continue Flonase 2 sprays in each nostril once a day for nasal congestion.  In the right nostril, point the applicator out toward the right ear. In the left nostril, point the applicator out toward the left ear Continue levocetirizine 5 mg once a day as needed for a runny nose Continue azelastine 2 sprays in each nostril twice a day as needed for a runny nose or drainage.  Continue saline nasal rinses at least once a day and as needed for nasal symptoms. Use this before any medicated nasal sprays for best result For dry nostrils, begin nasal saline gel as needed Refer to ENT for evaluation of anatomy and possible polyposis  Allergic conjunctivitis Continue Pataday eye drop one drop in each eye once a day as needed for red, itchy eyes Continue your dry eye drop from your eye doctor  Urticaria Continue hydroxyzine 25 mg once a day as needed for itch Continue Xolair once every 2 weeks and have access to an epinephrine auto-injector set If your symptoms re-occur, begin a journal of events that occurred for up to 6 hours before your symptoms began including foods and beverages consumed, soaps or perfumes you had contact with, and medications.   Reflux Continue dietary and lifestyle modifications as listed below Continue omeprazole 40 mg twice a day as previously prescribed by your GI specialist  Food allergy Continue to avoid  beans, peanuts, onion, and red dye. In case of an allergic reaction, take Benadryl 50 mg every 4 hours, and if life-threatening symptoms occur, inject with EpiPen 0.3 mg.  Call the clinic if this treatment plan is not working well for you  Follow up in 3 months or sooner if needed.

## 2022-11-14 ENCOUNTER — Ambulatory Visit: Payer: Medicare Other

## 2022-11-14 ENCOUNTER — Encounter: Payer: Self-pay | Admitting: Family Medicine

## 2022-11-14 ENCOUNTER — Ambulatory Visit (INDEPENDENT_AMBULATORY_CARE_PROVIDER_SITE_OTHER): Payer: Medicare Other | Admitting: Family Medicine

## 2022-11-14 VITALS — BP 100/70 | HR 92 | Temp 98.0°F | Resp 18 | Wt 189.5 lb

## 2022-11-14 DIAGNOSIS — J4550 Severe persistent asthma, uncomplicated: Secondary | ICD-10-CM | POA: Diagnosis not present

## 2022-11-14 DIAGNOSIS — J310 Chronic rhinitis: Secondary | ICD-10-CM

## 2022-11-14 DIAGNOSIS — Z930 Tracheostomy status: Secondary | ICD-10-CM | POA: Diagnosis not present

## 2022-11-14 DIAGNOSIS — L501 Idiopathic urticaria: Secondary | ICD-10-CM | POA: Diagnosis not present

## 2022-11-14 DIAGNOSIS — H1013 Acute atopic conjunctivitis, bilateral: Secondary | ICD-10-CM

## 2022-11-14 DIAGNOSIS — T781XXD Other adverse food reactions, not elsewhere classified, subsequent encounter: Secondary | ICD-10-CM

## 2022-11-14 MED ORDER — YUPELRI 175 MCG/3ML IN SOLN: 175.0000 ug | Freq: Every day | RESPIRATORY_TRACT | 5 refills | Status: DC

## 2022-11-14 MED ORDER — ALBUTEROL SULFATE (2.5 MG/3ML) 0.083% IN NEBU: INHALATION_SOLUTION | RESPIRATORY_TRACT | 2 refills | Status: DC

## 2022-11-14 MED ORDER — BUDESONIDE 0.5 MG/2ML IN SUSP: RESPIRATORY_TRACT | 1 refills | Status: AC

## 2022-11-14 MED ORDER — ARFORMOTEROL TARTRATE 15 MCG/2ML IN NEBU: 15.0000 ug | INHALATION_SOLUTION | Freq: Every day | RESPIRATORY_TRACT | 3 refills | Status: AC

## 2022-11-28 ENCOUNTER — Ambulatory Visit (INDEPENDENT_AMBULATORY_CARE_PROVIDER_SITE_OTHER): Payer: Medicare Other

## 2022-11-28 DIAGNOSIS — J4550 Severe persistent asthma, uncomplicated: Secondary | ICD-10-CM

## 2022-11-30 ENCOUNTER — Telehealth: Payer: Self-pay

## 2022-11-30 ENCOUNTER — Other Ambulatory Visit (HOSPITAL_COMMUNITY): Payer: Self-pay

## 2022-11-30 ENCOUNTER — Other Ambulatory Visit: Payer: Self-pay

## 2022-11-30 MED ORDER — ALBUTEROL SULFATE (2.5 MG/3ML) 0.083% IN NEBU: INHALATION_SOLUTION | RESPIRATORY_TRACT | 2 refills | Status: DC

## 2022-11-30 MED ORDER — ALBUTEROL SULFATE (2.5 MG/3ML) 0.083% IN NEBU: 2.5000 mg | INHALATION_SOLUTION | Freq: Four times a day (QID) | RESPIRATORY_TRACT | 1 refills | Status: DC | PRN

## 2022-11-30 NOTE — Telephone Encounter (Signed)
PA request received via CMM for Albuterol Sulfate (2.5 MG/3ML)0.083% nebulizer solution  Per test claim medication must be processed under Medicare Part B  Key: [id number]

## 2022-11-30 NOTE — Addendum Note (Signed)
Addended by: Berna Bue on: 11/30/2022 04:31 PM   Modules accepted: Orders

## 2022-11-30 NOTE — Telephone Encounter (Signed)
Sent rx back in with dx code

## 2022-12-12 ENCOUNTER — Ambulatory Visit (INDEPENDENT_AMBULATORY_CARE_PROVIDER_SITE_OTHER): Payer: Medicare Other

## 2022-12-12 DIAGNOSIS — J4540 Moderate persistent asthma, uncomplicated: Secondary | ICD-10-CM

## 2022-12-26 ENCOUNTER — Ambulatory Visit (INDEPENDENT_AMBULATORY_CARE_PROVIDER_SITE_OTHER): Payer: Medicare Other

## 2022-12-26 DIAGNOSIS — J4540 Moderate persistent asthma, uncomplicated: Secondary | ICD-10-CM | POA: Diagnosis not present

## 2023-01-09 ENCOUNTER — Ambulatory Visit (INDEPENDENT_AMBULATORY_CARE_PROVIDER_SITE_OTHER): Payer: Medicare Other

## 2023-01-09 DIAGNOSIS — L500 Allergic urticaria: Secondary | ICD-10-CM

## 2023-01-23 ENCOUNTER — Ambulatory Visit (INDEPENDENT_AMBULATORY_CARE_PROVIDER_SITE_OTHER): Payer: Medicare Other

## 2023-01-23 DIAGNOSIS — L500 Allergic urticaria: Secondary | ICD-10-CM

## 2023-02-06 ENCOUNTER — Ambulatory Visit (INDEPENDENT_AMBULATORY_CARE_PROVIDER_SITE_OTHER): Payer: Medicare Other

## 2023-02-06 DIAGNOSIS — L500 Allergic urticaria: Secondary | ICD-10-CM | POA: Diagnosis not present

## 2023-02-19 ENCOUNTER — Encounter: Payer: Self-pay | Admitting: Family Medicine

## 2023-02-19 ENCOUNTER — Ambulatory Visit (INDEPENDENT_AMBULATORY_CARE_PROVIDER_SITE_OTHER): Payer: Medicare Other | Admitting: Family Medicine

## 2023-02-19 ENCOUNTER — Other Ambulatory Visit: Payer: Self-pay

## 2023-02-19 VITALS — BP 122/76 | HR 89 | Temp 97.7°F | Resp 18 | Wt 188.0 lb

## 2023-02-19 DIAGNOSIS — L500 Allergic urticaria: Secondary | ICD-10-CM | POA: Diagnosis not present

## 2023-02-19 DIAGNOSIS — J310 Chronic rhinitis: Secondary | ICD-10-CM | POA: Diagnosis not present

## 2023-02-19 DIAGNOSIS — J4540 Moderate persistent asthma, uncomplicated: Secondary | ICD-10-CM | POA: Diagnosis not present

## 2023-02-19 DIAGNOSIS — T781XXD Other adverse food reactions, not elsewhere classified, subsequent encounter: Secondary | ICD-10-CM

## 2023-02-19 DIAGNOSIS — Z930 Tracheostomy status: Secondary | ICD-10-CM

## 2023-02-19 DIAGNOSIS — L508 Other urticaria: Secondary | ICD-10-CM

## 2023-02-19 DIAGNOSIS — K219 Gastro-esophageal reflux disease without esophagitis: Secondary | ICD-10-CM

## 2023-02-19 DIAGNOSIS — H1013 Acute atopic conjunctivitis, bilateral: Secondary | ICD-10-CM

## 2023-02-19 MED ORDER — MOMETASONE FUROATE 50 MCG/ACT NA SUSP: 2.0000 | Freq: Every day | NASAL | 1 refills | Status: DC

## 2023-02-19 MED ORDER — ALBUTEROL SULFATE (2.5 MG/3ML) 0.083% IN NEBU: INHALATION_SOLUTION | RESPIRATORY_TRACT | 2 refills | Status: AC

## 2023-02-19 MED ORDER — MONTELUKAST SODIUM 10 MG PO TABS: 10.0000 mg | ORAL_TABLET | Freq: Every day | ORAL | 1 refills | Status: AC

## 2023-02-19 NOTE — Progress Notes (Signed)
[address] [place] [address] Dept: [phone]  FOLLOW UP NOTE  Patient ID: Katie Gates, female    DOB: [birth year]  Age: 57 y.o. MRN: [id number] Date of Office Visit: 02/19/2023  Assessment  Chief Complaint: Follow-up (Nasal dryness) and Medication Refill  HPI Katie Gates is a 57 year old female who presents to the clinic for follow-up visit.  She was last seen in this clinic on 11/14/2022 by Thermon Leyland, FNP, for evaluation of asthma, allergic rhinitis, urticaria, reflux, and food allergy to beings, peanut, onion, and red dye.  At today's visit, she reports her asthma has been moderately well-controlled with very few symptoms if she stays inside and if she goes outside she experiences cough and shortness of breath.  She continues montelukast 10 mg once a day, budesonide via nebulizer twice a day, Perforomist via nebulizer twice a day, and Yupelri by nebulizer once a day.  She uses albuterol about 2 times a week with food.  She continues to wear a mask over her mouth as well as cover her trach when she is going outside.  She continues Xolair injections 300 mg once every 2 weeks with no large or local reactions.  She reports a significant decrease in her symptoms of asthma while continuing on Xolair injections.  She continues to follow-up with her pulmonology specialist at [facility] as recommended.  Allergic rhinitis is reported as moderately well-controlled with nasal congestion and clear rhinorrhea as the main symptoms.  She continues Xyzal 5 mg once a day, mometasone nasal spray 2 sprays in each nostril once a day, azelastine 2 sprays in each nostril once a day, and nasal saline rinses daily.  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  At this time, she reports that she is not interested in seeing an ENT specialist.  Reflux is reported as well-controlled with no symptoms including heartburn or vomiting.  She  continues omeprazole daily.  Allergic conjunctivitis is reported as moderately well-controlled with occasional symptoms including red and itchy eyes for which she continues olopatadine with relief of symptoms.  She reports 1 episode of urticaria which occurred after she took a bite of product containing peanut 3 days ago.  She reports that she did not swallow this food containing peanuts and spit it out immediately.  She took Benadryl immediately.  She did experience widespread hives, vomiting, diarrhea, headache, and scratchy throat which lasted for several hours.  She did not use her EpiPen at that time despite diarrhea, vomiting, and hives. Otherwise, she continues to avoid peanut, beans, onion, and red dye.  EpiPen's are up-to-date.  We thoroughly reviewed signs and symptoms of anaphylactic reaction and when to use epinephrine autoinjector.  She continues Xolair injections 300 mg once every other week with a significant decrease in her symptoms of chronic urticaria.  Her current medications are listed in the chart.  Drug Allergies:  Allergies  Allergen Reactions   Other Itching and Hives    Avoids corn kernels alone, can have corn as an ingredient. MM, RD 08/15/16   Peanut-Containing Drug Products Anaphylaxis   Red Dye Hives   Bean Pod Extract Hives    Avoids kidney beans   Fluticasone Other (See Comments)    NOSE BLEEDS   Garlic Other (See Comments)    STARTS ASTHMA ATTACKS   Hydroxyzine Itching    Can take Benadryl and other antihistamines fine   Keflex [Cephalexin]    Onion Other (See Comments)  RAW ONION=Headache   Robitussin (Alcohol Free) [Guaifenesin]    Tomato Itching    Physical Exam: BP 122/76 (BP Location: Left Arm, Patient Position: Sitting, Cuff Size: Normal)   Pulse 89   Temp 97.7 F (36.5 C) (Temporal)   Resp 18   Wt 188 lb (85.3 kg)   SpO2 98%   BMI 29.44 kg/m    Physical Exam Vitals reviewed.  Constitutional:      Appearance: Normal appearance.   HENT:     Head: Normocephalic and atraumatic.     Right Ear: Tympanic membrane normal.     Left Ear: Tympanic membrane normal.     Nose:     Comments: Bilateral nares edematous and pale with thin clear nasal drainage noted.  Pharynx erythematous with cobblestone appearance.  Trach patent.  Ears normal.  Eyes normal. Eyes:     Conjunctiva/sclera: Conjunctivae normal.  Cardiovascular:     Rate and Rhythm: Normal rate and regular rhythm.     Heart sounds: Normal heart sounds. No murmur heard. Pulmonary:     Effort: Pulmonary effort is normal.     Breath sounds: Normal breath sounds.     Comments: Scattered rhonchi which cleared with cough.  Otherwise lungs clear to auscultation Musculoskeletal:        General: Normal range of motion.     Cervical back: Normal range of motion and neck supple.  Skin:    General: Skin is warm and dry.  Neurological:     Mental Status: She is alert and oriented to person, place, and time.  Psychiatric:        Mood and Affect: Mood normal.        Behavior: Behavior normal.        Thought Content: Thought content normal.        Judgment: Judgment normal.     Assessment and Plan: 1. Moderate persistent asthma without complication   2. Tracheostomy in place [facility])   3. Chronic rhinitis   4. Allergic conjunctivitis of both eyes   5. Chronic urticaria   6. Multiple food allergies   7. Gastroesophageal reflux disease, unspecified whether esophagitis present     Meds ordered this encounter  Medications   albuterol (PROVENTIL) (2.5 MG/3ML) 0.083% nebulizer solution    Sig: INHALE 1 VIAL VIA NEBULIZER EVERY 6 (SIX) HOURS AS NEEDED FOR WHEEZING OR SHORTNESS OF BREATH.    Dispense:  75 mL    Refill:  2    J45.50   mometasone (NASONEX) 50 MCG/ACT nasal spray    Sig: Place 2 sprays into the nose daily.    Dispense:  3 each    Refill:  1   montelukast (SINGULAIR) 10 MG tablet    Sig: Take 1 tablet (10 mg total) by mouth at bedtime.    Dispense:  90  tablet    Refill:  1    Patient Instructions  Asthma  Continue budesonide 0.5 mg twice a day via nebulizer to prevent cough or wheeze Continue Perforomist 20 mcg twice a day via nebulizer to prevent cough or wheeze Continue Yupelri 175 mcg once a day via nebulizer to prevent cough or wheeze Continue montelukast 10 mg once a day to prevent cough or wheeze Continue albuterol 2 puffs every 4 hours as needed for cough or wheeze OR Instead use albuterol 0.083% solution via nebulizer one unit vial every 4 hours as needed for cough or wheeze Continue to follow-up with your pulmonologist, Dr. Su Monks as recommended  Rhinitis  Continue Flonase 2 sprays in each nostril once a day for nasal congestion.  In the right nostril, point the applicator out toward the right ear. In the left nostril, point the applicator out toward the left ear Continue levocetirizine 5 mg once a day as needed for a runny nose Continue azelastine 2 sprays in each nostril twice a day as needed for a runny nose or drainage.  Continue saline nasal rinses at least once a day and as needed for nasal symptoms. Use this before any medicated nasal sprays for best result For dry nostrils, begin nasal saline gel as needed Consider referral to ENT for evaluation of anatomy and possible polyposis  Allergic conjunctivitis Continue Pataday eye drop one drop in each eye once a day as needed for red, itchy eyes Continue your dry eye drop from your eye doctor  Urticaria Continue hydroxyzine 25 mg once a day as needed for itch Continue Xolair once every 2 weeks and have access to an epinephrine auto-injector set If your symptoms re-occur, begin a journal of events that occurred for up to 6 hours before your symptoms began including foods and beverages consumed, soaps or perfumes you had contact with, and medications.   Reflux Continue dietary and lifestyle modifications as listed below Continue omeprazole 40 mg twice a day as previously  prescribed by your GI specialist  Food allergy Continue to avoid beans, peanuts, onion, and red dye. In case of an allergic reaction, take Benadryl 50 mg every 4 hours, and if life-threatening symptoms occur, inject with EpiPen 0.3 mg.  Call the clinic if this treatment plan is not working well for you  Follow up in 6 months or sooner if needed.  Return in about 6 months (around 08/22/2023), or if symptoms worsen or fail to improve.    Thank you for the opportunity to care for this patient.  Please do not hesitate to contact me with questions.  Thermon Leyland, FNP [facility]

## 2023-02-19 NOTE — Patient Instructions (Addendum)
Asthma  Continue budesonide 0.5 mg twice a day via nebulizer to prevent cough or wheeze Continue Perforomist 20 mcg twice a day via nebulizer to prevent cough or wheeze Continue Yupelri 175 mcg once a day via nebulizer to prevent cough or wheeze Continue montelukast 10 mg once a day to prevent cough or wheeze Continue albuterol 2 puffs every 4 hours as needed for cough or wheeze OR Instead use albuterol 0.083% solution via nebulizer one unit vial every 4 hours as needed for cough or wheeze Continue to follow-up with your pulmonologist, Dr. Su Monks as recommended  Rhinitis Continue Flonase 2 sprays in each nostril once a day for nasal congestion.  In the right nostril, point the applicator out toward the right ear. In the left nostril, point the applicator out toward the left ear Continue levocetirizine 5 mg once a day as needed for a runny nose Continue azelastine 2 sprays in each nostril twice a day as needed for a runny nose or drainage.  Continue saline nasal rinses at least once a day and as needed for nasal symptoms. Use this before any medicated nasal sprays for best result For dry nostrils, begin nasal saline gel as needed Consider referral to ENT for evaluation of anatomy and possible polyposis  Allergic conjunctivitis Continue Pataday eye drop one drop in each eye once a day as needed for red, itchy eyes Continue your dry eye drop from your eye doctor  Urticaria Continue hydroxyzine 25 mg once a day as needed for itch Continue Xolair once every 2 weeks and have access to an epinephrine auto-injector set If your symptoms re-occur, begin a journal of events that occurred for up to 6 hours before your symptoms began including foods and beverages consumed, soaps or perfumes you had contact with, and medications.   Reflux Continue dietary and lifestyle modifications as listed below Continue omeprazole 40 mg twice a day as previously prescribed by your GI specialist  Food  allergy Continue to avoid beans, peanuts, onion, and red dye. In case of an allergic reaction, take Benadryl 50 mg every 4 hours, and if life-threatening symptoms occur, inject with EpiPen 0.3 mg.  Call the clinic if this treatment plan is not working well for you  Follow up in 6 months or sooner if needed.

## 2023-02-20 ENCOUNTER — Ambulatory Visit: Payer: Medicare Other

## 2023-03-05 ENCOUNTER — Ambulatory Visit (INDEPENDENT_AMBULATORY_CARE_PROVIDER_SITE_OTHER): Payer: Medicare Other

## 2023-03-05 DIAGNOSIS — J4540 Moderate persistent asthma, uncomplicated: Secondary | ICD-10-CM

## 2023-03-19 ENCOUNTER — Ambulatory Visit (INDEPENDENT_AMBULATORY_CARE_PROVIDER_SITE_OTHER): Payer: Medicare Other

## 2023-03-19 DIAGNOSIS — J4540 Moderate persistent asthma, uncomplicated: Secondary | ICD-10-CM | POA: Diagnosis not present

## 2023-04-02 ENCOUNTER — Ambulatory Visit (INDEPENDENT_AMBULATORY_CARE_PROVIDER_SITE_OTHER): Payer: Medicare Other

## 2023-04-02 DIAGNOSIS — J4540 Moderate persistent asthma, uncomplicated: Secondary | ICD-10-CM | POA: Diagnosis not present

## 2023-04-16 ENCOUNTER — Ambulatory Visit (INDEPENDENT_AMBULATORY_CARE_PROVIDER_SITE_OTHER): Payer: Medicare Other

## 2023-04-16 DIAGNOSIS — J4540 Moderate persistent asthma, uncomplicated: Secondary | ICD-10-CM | POA: Diagnosis not present

## 2023-04-24 ENCOUNTER — Other Ambulatory Visit: Payer: Self-pay | Admitting: Allergy & Immunology

## 2023-04-30 ENCOUNTER — Ambulatory Visit (INDEPENDENT_AMBULATORY_CARE_PROVIDER_SITE_OTHER): Payer: Medicare Other

## 2023-04-30 DIAGNOSIS — J4540 Moderate persistent asthma, uncomplicated: Secondary | ICD-10-CM | POA: Diagnosis not present

## 2023-05-14 ENCOUNTER — Ambulatory Visit: Payer: Medicare Other

## 2023-05-14 DIAGNOSIS — J4540 Moderate persistent asthma, uncomplicated: Secondary | ICD-10-CM | POA: Diagnosis not present

## 2023-05-28 ENCOUNTER — Ambulatory Visit: Payer: Medicare Other

## 2023-05-28 DIAGNOSIS — J4540 Moderate persistent asthma, uncomplicated: Secondary | ICD-10-CM | POA: Diagnosis not present

## 2023-06-11 ENCOUNTER — Ambulatory Visit: Payer: Medicare Other

## 2023-06-11 DIAGNOSIS — J309 Allergic rhinitis, unspecified: Secondary | ICD-10-CM | POA: Diagnosis not present

## 2023-06-26 ENCOUNTER — Ambulatory Visit: Payer: Medicare Other

## 2023-06-26 DIAGNOSIS — J4540 Moderate persistent asthma, uncomplicated: Secondary | ICD-10-CM | POA: Diagnosis not present

## 2023-07-17 ENCOUNTER — Ambulatory Visit: Payer: Medicare Other

## 2023-07-17 DIAGNOSIS — J4540 Moderate persistent asthma, uncomplicated: Secondary | ICD-10-CM | POA: Diagnosis not present

## 2023-07-23 ENCOUNTER — Other Ambulatory Visit: Payer: Self-pay | Admitting: Family Medicine

## 2023-07-24 ENCOUNTER — Ambulatory Visit: Payer: Medicare Other

## 2023-08-06 ENCOUNTER — Ambulatory Visit (INDEPENDENT_AMBULATORY_CARE_PROVIDER_SITE_OTHER): Payer: Medicare Other

## 2023-08-06 DIAGNOSIS — J4540 Moderate persistent asthma, uncomplicated: Secondary | ICD-10-CM | POA: Diagnosis not present

## 2023-08-21 ENCOUNTER — Ambulatory Visit: Payer: 59

## 2023-08-21 DIAGNOSIS — J4540 Moderate persistent asthma, uncomplicated: Secondary | ICD-10-CM | POA: Diagnosis not present

## 2023-08-23 ENCOUNTER — Other Ambulatory Visit: Payer: Self-pay | Admitting: Family Medicine

## 2023-09-04 ENCOUNTER — Ambulatory Visit (INDEPENDENT_AMBULATORY_CARE_PROVIDER_SITE_OTHER): Payer: 59

## 2023-09-04 DIAGNOSIS — J4540 Moderate persistent asthma, uncomplicated: Secondary | ICD-10-CM | POA: Diagnosis not present

## 2023-09-18 ENCOUNTER — Ambulatory Visit: Payer: 59

## 2023-09-18 DIAGNOSIS — J4540 Moderate persistent asthma, uncomplicated: Secondary | ICD-10-CM | POA: Diagnosis not present

## 2023-09-29 NOTE — Patient Instructions (Incomplete)
 Asthma  Continue budesonide 0.5 mg twice a day via nebulizer to prevent cough or wheeze Continue Perforomist 20 mcg twice a day via nebulizer to prevent cough or wheeze Continue Yupelri 175 mcg once a day via nebulizer to prevent cough or wheeze Continue montelukast 10 mg once a day to prevent cough or wheeze Continue albuterol 2 puffs every 4 hours as needed for cough or wheeze OR Instead use albuterol 0.083% solution via nebulizer one unit vial every 4 hours as needed for cough or wheeze Continue to follow-up with your pulmonologist, Dr. Su Monks as recommended Continue Daliresp as per your pulmonologist We will get you scheduled for a high resolution ct chest due to your symptoms of shortness of breath and tightness in chest. We will call you with results once they are back. Can consider changing/adding biologic based on results due to frequent steroids  Rhinitis Continue Flonase 2 sprays in each nostril once a day for nasal congestion.  In the right nostril, point the applicator out toward the right ear. In the left nostril, point the applicator out toward the left ear Continue levocetirizine 5 mg once a day as needed for a runny nose Continue azelastine 2 sprays in each nostril twice a day as needed for a runny nose or drainage.  Continue saline nasal rinses at least once a day and as needed for nasal symptoms. Use this before any medicated nasal sprays for best result For dry nostrils, begin nasal saline gel as needed Recommend talking with your ENT at [facility] for evaluation of anatomy and possible polyposis  Allergic conjunctivitis Continue Pataday eye drop one drop in each eye once a day as needed for red, itchy eyes Continue your dry eye drop from your eye doctor  Urticaria Continue hydroxyzine 25 mg once a day as needed for itch Continue Xolair once every 2 weeks and have access to an epinephrine auto-injector set If your symptoms re-occur, begin a journal of events that occurred for up  to 6 hours before your symptoms began including foods and beverages consumed, soaps or perfumes you had contact with, and medications.   Reflux Continue dietary and lifestyle modifications as listed below Continue omeprazole 40 mg twice a day as previously prescribed by your GI specialist  Food allergy Continue to avoid beans, peanuts, onion, garlic,and red dye. In case of an allergic reaction, take Benadryl 50 mg every 4 hours, and if life-threatening symptoms occur, inject with EpiPen 0.3 mg.  Call the clinic if this treatment plan is not working well for you  Follow up in 2 months with Dr. Marlynn Perking  or sooner if needed.

## 2023-09-30 ENCOUNTER — Encounter: Payer: Self-pay | Admitting: Family

## 2023-09-30 ENCOUNTER — Other Ambulatory Visit: Payer: Self-pay

## 2023-09-30 ENCOUNTER — Telehealth: Payer: Self-pay

## 2023-09-30 ENCOUNTER — Ambulatory Visit: Payer: 59 | Admitting: Family

## 2023-09-30 VITALS — BP 114/68 | HR 99 | Temp 98.0°F | Resp 20 | Wt 201.0 lb

## 2023-09-30 DIAGNOSIS — Z930 Tracheostomy status: Secondary | ICD-10-CM | POA: Diagnosis not present

## 2023-09-30 DIAGNOSIS — Z79899 Other long term (current) drug therapy: Secondary | ICD-10-CM

## 2023-09-30 DIAGNOSIS — J310 Chronic rhinitis: Secondary | ICD-10-CM | POA: Diagnosis not present

## 2023-09-30 DIAGNOSIS — J4540 Moderate persistent asthma, uncomplicated: Secondary | ICD-10-CM | POA: Diagnosis not present

## 2023-09-30 DIAGNOSIS — K219 Gastro-esophageal reflux disease without esophagitis: Secondary | ICD-10-CM

## 2023-09-30 DIAGNOSIS — R0602 Shortness of breath: Secondary | ICD-10-CM

## 2023-09-30 DIAGNOSIS — L508 Other urticaria: Secondary | ICD-10-CM

## 2023-09-30 DIAGNOSIS — T781XXD Other adverse food reactions, not elsewhere classified, subsequent encounter: Secondary | ICD-10-CM

## 2023-09-30 DIAGNOSIS — H1013 Acute atopic conjunctivitis, bilateral: Secondary | ICD-10-CM

## 2023-09-30 DIAGNOSIS — D84821 Immunodeficiency due to drugs: Secondary | ICD-10-CM

## 2023-09-30 MED ORDER — ALBUTEROL SULFATE (2.5 MG/3ML) 0.083% IN NEBU: 2.5000 mg | INHALATION_SOLUTION | Freq: Four times a day (QID) | RESPIRATORY_TRACT | 1 refills | Status: AC | PRN

## 2023-09-30 MED ORDER — YUPELRI 175 MCG/3ML IN SOLN: 175.0000 ug | Freq: Every day | RESPIRATORY_TRACT | 5 refills | Status: AC

## 2023-09-30 NOTE — Telephone Encounter (Signed)
 Called pt and informed her. Emailed info per request.

## 2023-09-30 NOTE — Progress Notes (Signed)
 [address] [place] [address] Dept: [phone]  FOLLOW UP NOTE  Patient ID: Katie Gates, female    DOB: 1966/02/04  Age: 58 y.o. MRN: [id number] Date of Office Visit: 09/30/2023  Assessment  Chief Complaint: Follow-up (Xolair reapproval)  HPI Katie Gates is a 58 year old female who presents today for follow-up of moderate persistent asthma without complication, tracheostomy in place, chronic rhinitis, allergic conjunctivitis of both eyes, chronic urticaria, multiple food allergies, and gastroesophageal reflux disease.  She was last seen on February 19, 2023 by Thermon Leyland, FNP.  She denies any new diagnosis or surgery since her last office visit.  Asthma: She reports that she is no longer taking the Advair 45/21 mcg that her pulmonologist prescribed her.  She reports that she did not do well with it.  She was recently added on Daliresp daily by her pulmonologist and she feels like it has helped.  She is also currently taking budesonide 0.5 mg twice a day, Perforomist 20 mcg twice a day, Yupelri 175 mcg once a day, montelukast 10 mg daily, and albuterol as needed.  She reports that she will have shortness of breath if climbing steps 2 or 3 times and tightness in her chest depending on if she gets exerted, or smells certain smells such as smoke.  She denies cough, wheeze, and nocturnal awakenings due to breathing problems.  She wears oxygen at 2-1/2 L per nasal cannula at night.  Since her last office visit she has received steroids 3 times from her pulmonologist.  They were prescribed in October, November and December.  In November she was also prescribed azithromycin.  Her last high-resolution CT chest was in 2018 and it showed:  "IMPRESSION: 1. No evidence to suggest interstitial lung disease. 2. No acute findings in the thorax." Her most recent chest x-ray from July 06, 2022 shows:  "No active cardiopulmonary disease.".  Rhinitis: She reports that she keeps clear rhinorrhea  and has postnasal drip at times.  She is currently taking Flonase nasal spray as needed.  If she misses more than 2 days she does have problems.  She does take levocetirizine 5 mg daily and azelastine nasal spray daily.  She also reports that she has not been able to taste and smell for years.  When asked if she would like a referral to ENT she reports that she has an ear nose and throat at [facility], but has never spoken with them about this.  She has not been treated for any sinus infections since we last saw her.  Allergic conjunctivitis: She reports that she has dry eyes and at times her eyes will be itchy.  She is using Blink eyedrops.  Urticaria: She reports that she has had hives maybe twice since her last appointment.  One time occurred when she ate food with too much garlic and red dye in it.  She reports itching and denies any concomitant cardiorespiratory and gastrointestinal symptoms.  She took children's Benadryl and this helped.  She has not had to use her epinephrine autoinjector device.  She continues to receive Xolair injections every 2 weeks and does feel like they have helped.  She denies any problems or reactions with her Xolair injections.  Food allergies: She reports that garlic will cause itching and a headache.  Otherwise she continues to avoid beans, peanuts, onion, garlic, and red dye.  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  She is  currently on Humira, prednisone 5 mg daily, and hydroxychloroquine.   Drug Allergies:  Allergies  Allergen Reactions   Other Itching and Hives    Avoids corn kernels alone, can have corn as an ingredient. MM, RD 08/15/16   Peanut-Containing Drug Products Anaphylaxis   Red Dye #40 (Allura Red) Hives   Bean Pod Extract Hives    Avoids kidney beans   Fluticasone Other (See Comments)    NOSE BLEEDS   Garlic Other (See Comments)    STARTS ASTHMA ATTACKS    Hydroxyzine Itching    Can take Benadryl and other antihistamines fine   Keflex [Cephalexin]    Onion Other (See Comments)    RAW ONION=Headache   Robitussin (Alcohol Free) [Guaifenesin]    Tomato Itching    Review of Systems: Negative except as per HPI   Physical Exam: BP 114/68   Pulse 99   Temp 98 F (36.7 C) (Temporal)   Resp 20   Wt 201 lb (91.2 kg)   SpO2 98%   BMI 31.48 kg/m    Physical Exam Constitutional:      Appearance: Normal appearance.  HENT:     Head: Normocephalic and atraumatic.     Comments: Pharynx normal, eyes normal, ears normal, nose: Bilateral lower turbinates moderately edematous and pale with clear drainage noted    Right Ear: Tympanic membrane, ear canal and external ear normal.     Left Ear: Tympanic membrane, ear canal and external ear normal.     Mouth/Throat:     Mouth: Mucous membranes are moist.     Pharynx: Oropharynx is clear.     Comments: Trach in place Eyes:     Conjunctiva/sclera: Conjunctivae normal.  Cardiovascular:     Rate and Rhythm: Regular rhythm.     Heart sounds: Normal heart sounds.  Pulmonary:     Effort: Pulmonary effort is normal.     Breath sounds: Normal breath sounds.     Comments: Lungs clear to auscultation Musculoskeletal:     Cervical back: Neck supple.  Skin:    General: Skin is warm.     Comments: No hives noted on exposed skin  Neurological:     Mental Status: She is alert and oriented to person, place, and time.  Psychiatric:        Mood and Affect: Mood normal.        Behavior: Behavior normal.        Thought Content: Thought content normal.        Judgment: Judgment normal.     Diagnostics:  None  Assessment and Plan: 1. Not well controlled moderate persistent asthma   2. Tracheostomy in place [facility])   3. Chronic rhinitis   4. Allergic conjunctivitis of both eyes   5. Chronic urticaria   6. Multiple food allergies   7. Gastroesophageal reflux disease, unspecified whether esophagitis  present   8. Shortness of breath   9. 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    Dispense:  90 mL    Refill:  5    Dx Code: J45.40, Please file with Medicare part B.   albuterol (PROVENTIL) (2.5 MG/3ML) 0.083% nebulizer solution    Sig: Take 3 mLs (2.5 mg total) by nebulization every 6 (six) hours as needed for wheezing or shortness of breath.  Dispense:  75 mL    Refill:  1    Patient Instructions  Asthma  Continue budesonide 0.5 mg twice a day via nebulizer to prevent cough or wheeze Continue Perforomist 20 mcg twice a day via nebulizer to prevent cough or wheeze Continue Yupelri 175 mcg once a day via nebulizer to prevent cough or wheeze Continue montelukast 10 mg once a day to prevent cough or wheeze Continue albuterol 2 puffs every 4 hours as needed for cough or wheeze OR Instead use albuterol 0.083% solution via nebulizer one unit vial every 4 hours as needed for cough or wheeze Continue to follow-up with your pulmonologist, Dr. Su Monks as recommended Continue Daliresp as per your pulmonologist We will get you scheduled for a high resolution ct chest due to your symptoms of shortness of breath and tightness in chest. We will call you with results once they are back. Can consider changing/adding biologic based on results due to frequent steroids  Rhinitis Continue Flonase 2 sprays in each nostril once a day for nasal congestion.  In the right nostril, point the applicator out toward the right ear. In the left nostril, point the applicator out toward the left ear Continue levocetirizine 5 mg once a day as needed for a runny nose Continue azelastine 2 sprays in each nostril twice a day as needed for a runny nose or drainage.  Continue saline nasal rinses at least once a day and as needed for nasal symptoms. Use this  before any medicated nasal sprays for best result For dry nostrils, begin nasal saline gel as needed Recommend talking with your ENT at [facility] for evaluation of anatomy and possible polyposis  Allergic conjunctivitis Continue Pataday eye drop one drop in each eye once a day as needed for red, itchy eyes Continue your dry eye drop from your eye doctor  Urticaria Continue hydroxyzine 25 mg once a day as needed for itch Continue Xolair once every 2 weeks and have access to an epinephrine auto-injector set If your symptoms re-occur, begin a journal of events that occurred for up to 6 hours before your symptoms began including foods and beverages consumed, soaps or perfumes you had contact with, and medications.   Reflux Continue dietary and lifestyle modifications as listed below Continue omeprazole 40 mg twice a day as previously prescribed by your GI specialist  Food allergy Continue to avoid beans, peanuts, onion, garlic,and red dye. In case of an allergic reaction, take Benadryl 50 mg every 4 hours, and if life-threatening symptoms occur, inject with EpiPen 0.3 mg.  Call the clinic if this treatment plan is not working well for you  Follow up in 2 months with Dr. Marlynn Perking  or sooner if needed.  Return in about 2 months (around 11/28/2023), or if symptoms worsen or fail to improve.    Thank you for the opportunity to care for this patient.  Please do not hesitate to contact me with questions.  Nehemiah Settle, FNP [facility]

## 2023-09-30 NOTE — Telephone Encounter (Signed)
 Pt needs high resolution CT scan per Nehemiah Settle NP, need to call insurance for location and check if precert needed

## 2023-09-30 NOTE — Telephone Encounter (Signed)
 Spoke with Medicare and PA is not required- Reference # for call is [id number]. Scheduled at [facility] for this Thursday 2/27 at 8:30.

## 2023-10-02 ENCOUNTER — Ambulatory Visit: Payer: 59

## 2023-10-03 ENCOUNTER — Ambulatory Visit (HOSPITAL_BASED_OUTPATIENT_CLINIC_OR_DEPARTMENT_OTHER)
Admission: RE | Admit: 2023-10-03 | Discharge: 2023-10-03 | Disposition: A | Discharge status: Home / Self Care | Payer: 59 | Source: Ambulatory Visit | Attending: Family | Admitting: Family

## 2023-10-03 DIAGNOSIS — J4540 Moderate persistent asthma, uncomplicated: Secondary | ICD-10-CM | POA: Diagnosis present

## 2023-10-03 DIAGNOSIS — R0602 Shortness of breath: Secondary | ICD-10-CM | POA: Insufficient documentation

## 2023-10-15 ENCOUNTER — Ambulatory Visit

## 2023-10-15 DIAGNOSIS — J4540 Moderate persistent asthma, uncomplicated: Secondary | ICD-10-CM | POA: Diagnosis not present

## 2023-10-16 ENCOUNTER — Ambulatory Visit

## 2023-10-28 NOTE — Progress Notes (Signed)
 Please let Katie Gates know that her high resolution ct chest did not show any interstitial lung disease or findings to explain her shortness of breath and cough. Please have her schedule a follow up appointment with Dr. Marlynn Perking in 1 month.

## 2023-10-31 ENCOUNTER — Ambulatory Visit

## 2023-10-31 DIAGNOSIS — J4540 Moderate persistent asthma, uncomplicated: Secondary | ICD-10-CM

## 2023-11-01 ENCOUNTER — Ambulatory Visit

## 2023-11-05 ENCOUNTER — Ambulatory Visit

## 2023-11-14 ENCOUNTER — Ambulatory Visit

## 2023-11-14 DIAGNOSIS — J4540 Moderate persistent asthma, uncomplicated: Secondary | ICD-10-CM | POA: Diagnosis not present

## 2023-11-28 ENCOUNTER — Ambulatory Visit

## 2023-12-09 ENCOUNTER — Ambulatory Visit

## 2023-12-24 ENCOUNTER — Ambulatory Visit (INDEPENDENT_AMBULATORY_CARE_PROVIDER_SITE_OTHER)

## 2023-12-24 DIAGNOSIS — L508 Other urticaria: Secondary | ICD-10-CM | POA: Diagnosis not present

## 2024-01-07 ENCOUNTER — Ambulatory Visit

## 2024-01-07 DIAGNOSIS — J4540 Moderate persistent asthma, uncomplicated: Secondary | ICD-10-CM | POA: Diagnosis not present

## 2024-01-20 ENCOUNTER — Other Ambulatory Visit: Payer: Self-pay | Admitting: Family Medicine

## 2024-01-21 ENCOUNTER — Ambulatory Visit (INDEPENDENT_AMBULATORY_CARE_PROVIDER_SITE_OTHER)

## 2024-01-21 DIAGNOSIS — J4540 Moderate persistent asthma, uncomplicated: Secondary | ICD-10-CM

## 2024-01-21 NOTE — Telephone Encounter (Signed)
 Refill for Nasonex  x 1 month supply with 1 refill sent to [facility] pharmacy.

## 2024-02-04 ENCOUNTER — Ambulatory Visit

## 2024-02-04 DIAGNOSIS — J4540 Moderate persistent asthma, uncomplicated: Secondary | ICD-10-CM

## 2024-02-18 ENCOUNTER — Ambulatory Visit

## 2024-02-18 DIAGNOSIS — J4540 Moderate persistent asthma, uncomplicated: Secondary | ICD-10-CM | POA: Diagnosis not present

## 2024-03-03 ENCOUNTER — Ambulatory Visit (INDEPENDENT_AMBULATORY_CARE_PROVIDER_SITE_OTHER)

## 2024-03-03 DIAGNOSIS — J4540 Moderate persistent asthma, uncomplicated: Secondary | ICD-10-CM

## 2024-03-17 ENCOUNTER — Ambulatory Visit

## 2024-03-17 DIAGNOSIS — J4540 Moderate persistent asthma, uncomplicated: Secondary | ICD-10-CM | POA: Diagnosis not present

## 2024-03-19 ENCOUNTER — Other Ambulatory Visit: Payer: Self-pay | Admitting: Family Medicine

## 2024-03-31 ENCOUNTER — Ambulatory Visit (INDEPENDENT_AMBULATORY_CARE_PROVIDER_SITE_OTHER)

## 2024-03-31 DIAGNOSIS — J4540 Moderate persistent asthma, uncomplicated: Secondary | ICD-10-CM | POA: Diagnosis not present

## 2024-04-14 ENCOUNTER — Ambulatory Visit

## 2024-04-14 DIAGNOSIS — L508 Other urticaria: Secondary | ICD-10-CM

## 2024-04-15 ENCOUNTER — Other Ambulatory Visit: Payer: Self-pay | Admitting: Allergy & Immunology

## 2024-04-29 ENCOUNTER — Ambulatory Visit

## 2024-05-25 ENCOUNTER — Ambulatory Visit

## 2024-05-25 DIAGNOSIS — L508 Other urticaria: Secondary | ICD-10-CM | POA: Diagnosis not present

## 2024-06-09 ENCOUNTER — Ambulatory Visit

## 2024-06-09 DIAGNOSIS — J4540 Moderate persistent asthma, uncomplicated: Secondary | ICD-10-CM | POA: Diagnosis not present

## 2024-06-23 ENCOUNTER — Encounter: Payer: Self-pay | Admitting: Family Medicine

## 2024-06-23 ENCOUNTER — Ambulatory Visit

## 2024-06-23 ENCOUNTER — Ambulatory Visit (INDEPENDENT_AMBULATORY_CARE_PROVIDER_SITE_OTHER): Admitting: Family Medicine

## 2024-06-23 VITALS — BP 110/76 | HR 90 | Temp 97.9°F | Resp 20 | Wt 206.7 lb

## 2024-06-23 DIAGNOSIS — J019 Acute sinusitis, unspecified: Secondary | ICD-10-CM

## 2024-06-23 DIAGNOSIS — J4540 Moderate persistent asthma, uncomplicated: Secondary | ICD-10-CM | POA: Diagnosis not present

## 2024-06-23 DIAGNOSIS — J310 Chronic rhinitis: Secondary | ICD-10-CM

## 2024-06-23 DIAGNOSIS — H1013 Acute atopic conjunctivitis, bilateral: Secondary | ICD-10-CM

## 2024-06-23 DIAGNOSIS — Z930 Tracheostomy status: Secondary | ICD-10-CM

## 2024-06-23 DIAGNOSIS — L508 Other urticaria: Secondary | ICD-10-CM

## 2024-06-23 DIAGNOSIS — K219 Gastro-esophageal reflux disease without esophagitis: Secondary | ICD-10-CM

## 2024-06-23 DIAGNOSIS — B9689 Other specified bacterial agents as the cause of diseases classified elsewhere: Secondary | ICD-10-CM

## 2024-06-23 DIAGNOSIS — Z91018 Allergy to other foods: Secondary | ICD-10-CM

## 2024-06-23 DIAGNOSIS — T7819XD Other adverse food reactions, not elsewhere classified, subsequent encounter: Secondary | ICD-10-CM

## 2024-06-23 MED ORDER — EPINEPHRINE 0.3 MG/0.3ML IJ SOAJ: 0.3000 mg | INTRAMUSCULAR | 2 refills | Status: AC | PRN

## 2024-06-23 MED ORDER — AMOXICILLIN-POT CLAVULANATE 875-125 MG PO TABS: 1.0000 | ORAL_TABLET | Freq: Two times a day (BID) | ORAL | 0 refills | Status: AC

## 2024-06-23 NOTE — Patient Instructions (Addendum)
 Acute bacterial sinusitis Begin Augmentin  875 mg twice a day for the next 7 days Continue nasal saline rinses followed by Flonase and azelastine nasal sprays Begin Mucinex [phone] mg twice a day and increase fluid intake as tolerated to thin mucus  Asthma  Continue budesonide  0.5 mg twice a day via nebulizer to prevent cough or wheeze Continue Perforomist  20 mcg twice a day via nebulizer to prevent cough or wheeze Continue Yupelri  175 mcg once a day via nebulizer to prevent cough or wheeze Continue montelukast  10 mg once a day to prevent cough or wheeze Continue albuterol  2 puffs every 4 hours as needed for cough or wheeze OR Instead use albuterol  0.083% solution via nebulizer one unit vial every 4 hours as needed for cough or wheeze Continue to follow-up with your pulmonologist, Dr. Nathanael as recommended  Rhinitis Continue Flonase 2 sprays in each nostril once a day for nasal congestion.  In the right nostril, point the applicator out toward the right ear. In the left nostril, point the applicator out toward the left ear Continue levocetirizine 5 mg once a day as needed for a runny nose Continue azelastine 2 sprays in each nostril twice a day as needed for a runny nose or drainage.  Continue saline nasal rinses at least once a day and as needed for nasal symptoms. Use this before any medicated nasal sprays for best result For dry nostrils, begin nasal saline gel as needed Consider referral to ENT for evaluation of anatomy and possible polyposis  Allergic conjunctivitis Continue Pataday  eye drop one drop in each eye once a day as needed for red, itchy eyes Continue your dry eye drop from your eye doctor  Urticaria Continue hydroxyzine  25 mg once a day as needed for itch Continue Xolair  300 mg once every 2 weeks and have access to an epinephrine  auto-injector set If your symptoms re-occur, begin a journal of events that occurred for up to 6 hours before your symptoms began including foods  and beverages consumed, soaps or perfumes you had contact with, and medications.   Reflux Continue dietary and lifestyle modifications as listed below Continue omeprazole 40 mg twice a day as previously prescribed by your GI specialist  Food allergy Continue to avoid beans, peanuts, onion, and red dye. In case of an allergic reaction, take Benadryl 50 mg every 4 hours, and if life-threatening symptoms occur, inject with EpiPen  0.3 mg. Consider updating your food allergy testing.   Call the clinic if this treatment plan is not working well for you  Follow up in 2 months or sooner if needed.

## 2024-06-23 NOTE — Progress Notes (Signed)
 [address] [place] [address] Dept: [phone]  FOLLOW UP NOTE  Patient ID: Katie Gates, female    DOB: [birth year]  Age: 58 y.o. MRN: [id number] Date of Office Visit: 06/23/2024  Assessment  Chief Complaint: Medication Refill and Asthma  HPI Katie Gates is a 58 year old female who presents to the clinic for follow-up visit.  She was last seen in this clinic on 09/30/2023 by Wanda Craze, FNP.,  For evaluation of asthma, chronic rhinitis, chronic conjunctivitis, reflux, urticaria, and food allergy to red dye, beans, peanuts, onion, and garlic.  Discussed the use of AI scribe software for clinical note transcription with the patient, who gave verbal consent to proceed.  History of Present Illness Katie Gates is a 58 year old female who presents with sinus pressure and headache.  At today's visit, she reports her asthma has been well-controlled with occasional cough as the main symptom.  She denies shortness of breath or wheeze with activity or rest.  She continues montelukast  10 mg once a day, budesonide  0.5 mg twice a day by nebulizer, Daliresp, Perforomist  20 mcg twice a day by nebulizer, and Yupelri  175 mcg once a day via nebulizer.  She continues albuterol  about 2 days of the week with relief of symptoms.  She continues to follow-up with her pulmonology specialist as recommended.  Allergic rhinitis is reported as moderately well-controlled with symptoms including nasal congestion, sneezing, and copious postnasal drainage.  She reports headache over both eyes over the last 10 days.  She denies fever, sweats, chills, or or sick contacts.  She reports occasional thick clear to yellow nasal drainage.  She continues Flonase, azelastine, and Navage nasal rinse.  Allergic conjunctivitis is reported as moderately well-controlled with occasional red and itchy eyes occurring mainly in the fall.  She is not currently using any medical intervention for allergic conjunctivitis.   She continues to use blink eyedrops as needed for dry eyes.  She denies any recent episodes of urticaria and continues hydroxyzine  twice a day and Xolair  injections.  She denies large or local reactions with Xolair  injections.  She reports a significant decrease in her symptoms of urticaria while continuing on Xolair  injections.  Reflux is reported as moderately well-controlled as she notes certain foods cause her to experience heartburn.  She reports that she tries to avoid those foods.  She continues omeprazole 40 mg twice a day with relief of symptoms.  She continues to follow-up with her GI specialist as recommended.  She continues to avoid red dye, kidney beans, peanuts, onions and garlic with no accidental ingestion or EpiPen  use since her last visit to this clinic.  EpiPen  sent refilled at today's visit.  Her current medications are listed in the chart.   Drug Allergies:  Allergies  Allergen Reactions   Other Itching and Hives    Avoids corn kernels alone, can have corn as an ingredient. MM, RD 08/15/16   Peanut-Containing Drug Products Anaphylaxis   Red Dye #40 (Allura Red) Hives   Bean Pod Extract Hives    Avoids kidney beans   Fluticasone Other (See Comments)    NOSE BLEEDS   Garlic Other (See Comments)    STARTS ASTHMA ATTACKS   Hydroxyzine  Itching    Can take Benadryl and other antihistamines fine   Keflex [Cephalexin]    Onion Other (See Comments)    RAW ONION=Headache   Robitussin (Alcohol Free) [Guaifenesin]    Tomato Itching    Physical Exam: BP 110/76 (BP Location: Left  Arm, Patient Position: Sitting, Cuff Size: Large)   Pulse 90   Temp 97.9 F (36.6 C) (Temporal)   Resp 20   Wt 206 lb 11.2 oz (93.8 kg)   SpO2 98%   BMI 32.37 kg/m    Physical Exam Vitals reviewed.  Constitutional:      Appearance: Normal appearance.  HENT:     Head: Normocephalic and atraumatic.     Right Ear: Tympanic membrane normal.     Left Ear: Tympanic membrane normal.      Nose:     Comments: Bilateral nares edematous and pale with thick yellow nasal drainage noted.  Pharynx slightly erythematous with no exudate.  Ears normal.  Eyes normal.    Mouth/Throat:     Pharynx: Oropharynx is clear.  Eyes:     Conjunctiva/sclera: Conjunctivae normal.  Cardiovascular:     Rate and Rhythm: Normal rate and regular rhythm.     Heart sounds: Normal heart sounds. No murmur heard. Pulmonary:     Effort: Pulmonary effort is normal.     Breath sounds: Normal breath sounds.     Comments: Lung is clear to auscultation.  Jamal remains patent Musculoskeletal:        General: Normal range of motion.     Cervical back: Normal range of motion and neck supple.  Skin:    General: Skin is warm and dry.  Neurological:     Mental Status: She is alert and oriented to person, place, and time.  Psychiatric:        Behavior: Behavior normal.        Thought Content: Thought content normal.        Judgment: Judgment normal.     Assessment and Plan: 1. Moderate persistent asthma, unspecified whether complicated   2. Acute bacterial sinusitis   3. Chronic rhinitis   4. Allergic conjunctivitis of both eyes   5. Chronic urticaria   6. Tracheostomy in place (HCC)   7. Multiple food allergies   8. Gastroesophageal reflux disease, unspecified whether esophagitis present     Meds ordered this encounter  Medications   amoxicillin -clavulanate (AUGMENTIN ) 875-125 MG tablet    Sig: Take 1 tablet by mouth 2 (two) times daily.    Dispense:  20 tablet    Refill:  0   EPINEPHrine  0.3 mg/0.3 mL IJ SOAJ injection    Sig: Inject 0.3 mg into the muscle as needed for anaphylaxis.    Dispense:  2 each    Refill:  2    Patient Instructions  Acute bacterial sinusitis Begin Augmentin  875 mg twice a day for the next 7 days Continue nasal saline rinses followed by Flonase and azelastine nasal sprays Begin Mucinex [phone] mg twice a day and increase fluid intake as tolerated to thin  mucus  Asthma  Continue budesonide  0.5 mg twice a day via nebulizer to prevent cough or wheeze Continue Perforomist  20 mcg twice a day via nebulizer to prevent cough or wheeze Continue Yupelri  175 mcg once a day via nebulizer to prevent cough or wheeze Continue montelukast  10 mg once a day to prevent cough or wheeze Continue albuterol  2 puffs every 4 hours as needed for cough or wheeze OR Instead use albuterol  0.083% solution via nebulizer one unit vial every 4 hours as needed for cough or wheeze Continue to follow-up with your pulmonologist, Dr. Nathanael as recommended  Rhinitis Continue Flonase 2 sprays in each nostril once a day for nasal congestion.  In the right nostril, point  the applicator out toward the right ear. In the left nostril, point the applicator out toward the left ear Continue levocetirizine 5 mg once a day as needed for a runny nose Continue azelastine 2 sprays in each nostril twice a day as needed for a runny nose or drainage.  Continue saline nasal rinses at least once a day and as needed for nasal symptoms. Use this before any medicated nasal sprays for best result For dry nostrils, begin nasal saline gel as needed Consider referral to ENT for evaluation of anatomy and possible polyposis  Allergic conjunctivitis Continue Pataday  eye drop one drop in each eye once a day as needed for red, itchy eyes Continue your dry eye drop from your eye doctor  Urticaria Continue hydroxyzine  25 mg once a day as needed for itch Continue Xolair  300 mg once every 2 weeks and have access to an epinephrine  auto-injector set If your symptoms re-occur, begin a journal of events that occurred for up to 6 hours before your symptoms began including foods and beverages consumed, soaps or perfumes you had contact with, and medications.   Reflux Continue dietary and lifestyle modifications as listed below Continue omeprazole 40 mg twice a day as previously prescribed by your GI specialist  Food  allergy Continue to avoid beans, peanuts, onion, and red dye. In case of an allergic reaction, take Benadryl 50 mg every 4 hours, and if life-threatening symptoms occur, inject with EpiPen  0.3 mg. Consider updating your food allergy testing.   Call the clinic if this treatment plan is not working well for you  Follow up in 2 months or sooner if needed.  Return in about 2 months (around 08/23/2024), or if symptoms worsen or fail to improve.    Thank you for the opportunity to care for this patient.  Please do not hesitate to contact me with questions.  Arlean Mutter, FNP [facility] 

## 2024-07-07 ENCOUNTER — Ambulatory Visit

## 2024-07-07 DIAGNOSIS — J4540 Moderate persistent asthma, uncomplicated: Secondary | ICD-10-CM

## 2024-07-17 ENCOUNTER — Other Ambulatory Visit: Payer: Self-pay | Admitting: Family Medicine

## 2024-07-21 ENCOUNTER — Ambulatory Visit

## 2024-07-21 DIAGNOSIS — L501 Idiopathic urticaria: Secondary | ICD-10-CM

## 2024-08-04 ENCOUNTER — Ambulatory Visit (INDEPENDENT_AMBULATORY_CARE_PROVIDER_SITE_OTHER)

## 2024-08-04 ENCOUNTER — Telehealth: Payer: Self-pay

## 2024-08-04 DIAGNOSIS — L501 Idiopathic urticaria: Secondary | ICD-10-CM

## 2024-08-04 NOTE — Telephone Encounter (Signed)
 Patient here for biologic and asked how many she has left. Advised she has enough for her next injection. Pt said she received email saying our office needs to call pharmacy so they know when to deliver next shot.

## 2024-08-04 NOTE — Telephone Encounter (Signed)
 I order based on doses on hand and next appt

## 2024-08-18 ENCOUNTER — Ambulatory Visit

## 2024-08-18 DIAGNOSIS — L501 Idiopathic urticaria: Secondary | ICD-10-CM | POA: Diagnosis not present

## 2024-09-01 ENCOUNTER — Ambulatory Visit

## 2024-09-01 DIAGNOSIS — L501 Idiopathic urticaria: Secondary | ICD-10-CM

## 2024-09-15 ENCOUNTER — Ambulatory Visit
